# Patient Record
Sex: Female | Born: 1980 | Race: White | Hispanic: Yes | Marital: Married | Smoking: Never smoker
Health system: Southern US, Community
[De-identification: ages and names within clinical notes are randomized; demographics above are authoritative.]

## PROBLEM LIST (undated history)

## (undated) DIAGNOSIS — F419 Anxiety disorder, unspecified: Secondary | ICD-10-CM

## (undated) DIAGNOSIS — F32A Depression, unspecified: Secondary | ICD-10-CM

## (undated) DIAGNOSIS — F329 Major depressive disorder, single episode, unspecified: Secondary | ICD-10-CM

## (undated) DIAGNOSIS — E119 Type 2 diabetes mellitus without complications: Secondary | ICD-10-CM

## (undated) HISTORY — DX: Major depressive disorder, single episode, unspecified: F32.9

## (undated) HISTORY — DX: Depression, unspecified: F32.A

---

## 2000-01-13 ENCOUNTER — Encounter: Payer: Self-pay | Admitting: Obstetrics

## 2000-01-13 ENCOUNTER — Inpatient Hospital Stay (HOSPITAL_COMMUNITY): Admission: AD | Admit: 2000-01-13 | Discharge: 2000-01-13 | Payer: Self-pay | Admitting: Obstetrics

## 2000-01-31 ENCOUNTER — Emergency Department (HOSPITAL_COMMUNITY): Admission: EM | Admit: 2000-01-31 | Discharge: 2000-01-31 | Payer: Self-pay | Admitting: Emergency Medicine

## 2000-03-16 ENCOUNTER — Inpatient Hospital Stay (HOSPITAL_COMMUNITY): Admission: AD | Admit: 2000-03-16 | Discharge: 2000-03-16 | Payer: Self-pay | Admitting: Obstetrics

## 2000-03-31 ENCOUNTER — Encounter: Payer: Self-pay | Admitting: *Deleted

## 2000-03-31 ENCOUNTER — Inpatient Hospital Stay (HOSPITAL_COMMUNITY): Admission: RE | Admit: 2000-03-31 | Discharge: 2000-03-31 | Payer: Self-pay | Admitting: *Deleted

## 2000-04-02 ENCOUNTER — Inpatient Hospital Stay (HOSPITAL_COMMUNITY): Admission: AD | Admit: 2000-04-02 | Discharge: 2000-04-02 | Payer: Self-pay | Admitting: *Deleted

## 2000-04-03 ENCOUNTER — Encounter (HOSPITAL_COMMUNITY): Admission: RE | Admit: 2000-04-03 | Discharge: 2000-04-20 | Payer: Self-pay | Admitting: *Deleted

## 2000-04-13 ENCOUNTER — Inpatient Hospital Stay (HOSPITAL_COMMUNITY): Admission: AD | Admit: 2000-04-13 | Discharge: 2000-04-15 | Payer: Self-pay | Admitting: Obstetrics & Gynecology

## 2000-04-16 ENCOUNTER — Observation Stay (HOSPITAL_COMMUNITY): Admission: AD | Admit: 2000-04-16 | Discharge: 2000-04-17 | Payer: Self-pay | Admitting: Obstetrics & Gynecology

## 2000-04-16 ENCOUNTER — Encounter: Payer: Self-pay | Admitting: Obstetrics & Gynecology

## 2002-11-21 ENCOUNTER — Encounter: Payer: Self-pay | Admitting: Emergency Medicine

## 2002-11-21 ENCOUNTER — Emergency Department (HOSPITAL_COMMUNITY): Admission: EM | Admit: 2002-11-21 | Discharge: 2002-11-21 | Payer: Self-pay | Admitting: Emergency Medicine

## 2005-03-26 ENCOUNTER — Inpatient Hospital Stay (HOSPITAL_COMMUNITY): Admission: AD | Admit: 2005-03-26 | Discharge: 2005-03-26 | Payer: Self-pay | Admitting: Obstetrics

## 2005-04-05 ENCOUNTER — Inpatient Hospital Stay (HOSPITAL_COMMUNITY): Admission: AD | Admit: 2005-04-05 | Discharge: 2005-04-07 | Payer: Self-pay | Admitting: Obstetrics

## 2008-08-09 ENCOUNTER — Encounter: Admission: RE | Admit: 2008-08-09 | Discharge: 2008-08-09 | Payer: Self-pay | Admitting: Specialist

## 2008-09-12 ENCOUNTER — Encounter (INDEPENDENT_AMBULATORY_CARE_PROVIDER_SITE_OTHER): Payer: Self-pay | Admitting: General Surgery

## 2008-09-12 ENCOUNTER — Ambulatory Visit (HOSPITAL_COMMUNITY): Admission: RE | Admit: 2008-09-12 | Discharge: 2008-09-13 | Payer: Self-pay | Admitting: General Surgery

## 2009-09-11 IMAGING — US US ABDOMEN COMPLETE
1 series · 14 of 25 positions shown · non-contrast
Comparison: None

CLINICAL DATA: Persistent right upper quadrant pain.

ABDOMEN ULTRASOUND
TECHNIQUE: Complete abdominal ultrasound examination was performed
including evaluation of the liver, gallbladder, bile ducts,
pancreas, kidneys, spleen, IVC, and abdominal aorta.

[Series 1: us abdomen complete · 0.29mm/px · 14 of 85 slices shown]
[im 1/85]
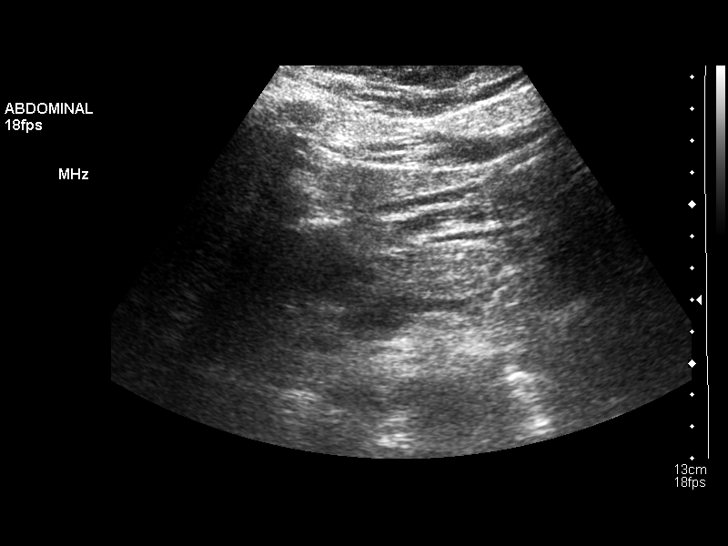
[im 8/85]
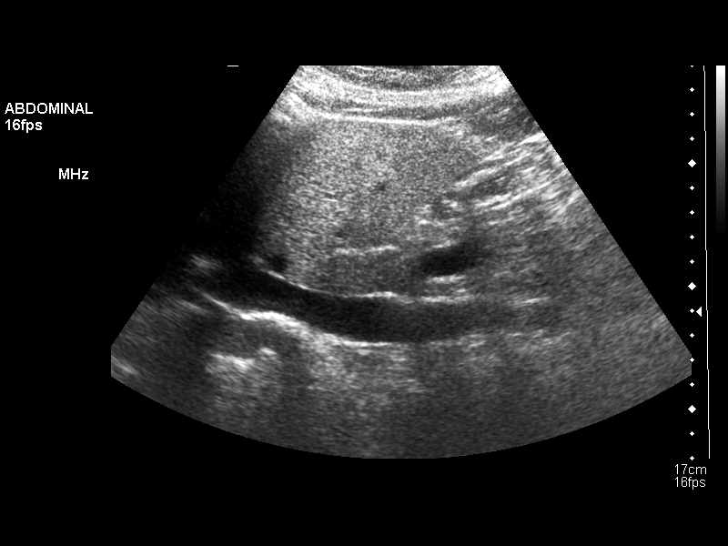
[im 15/85]
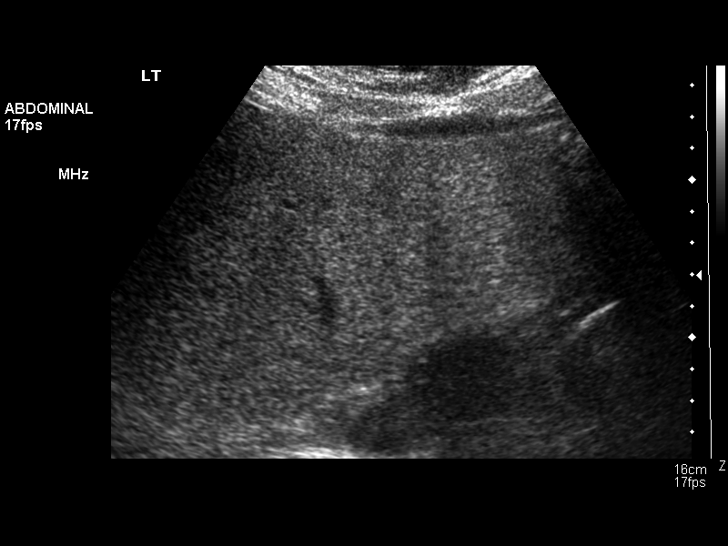
[im 22/85]
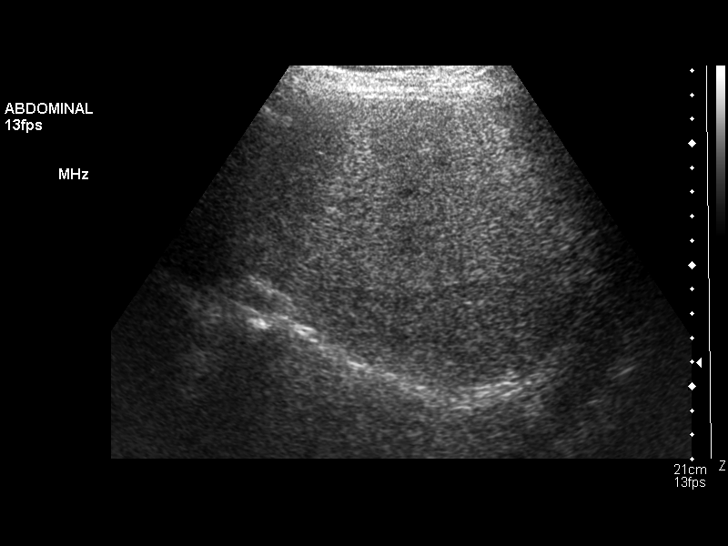
[im 29/85]
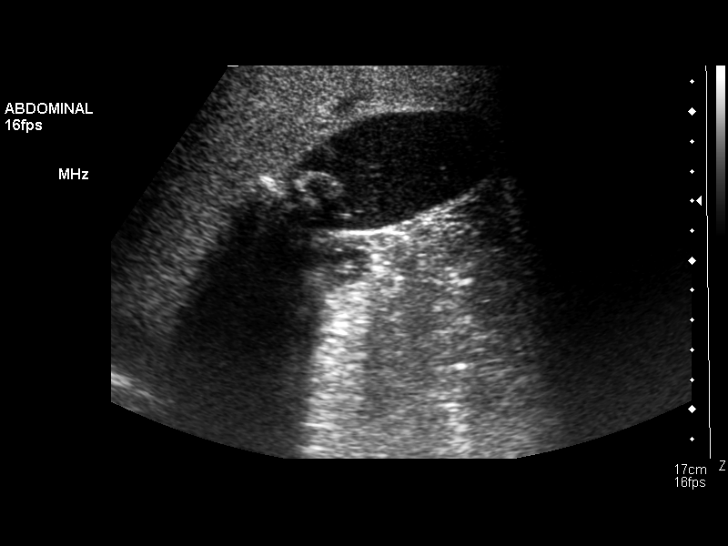
[im 32/85]
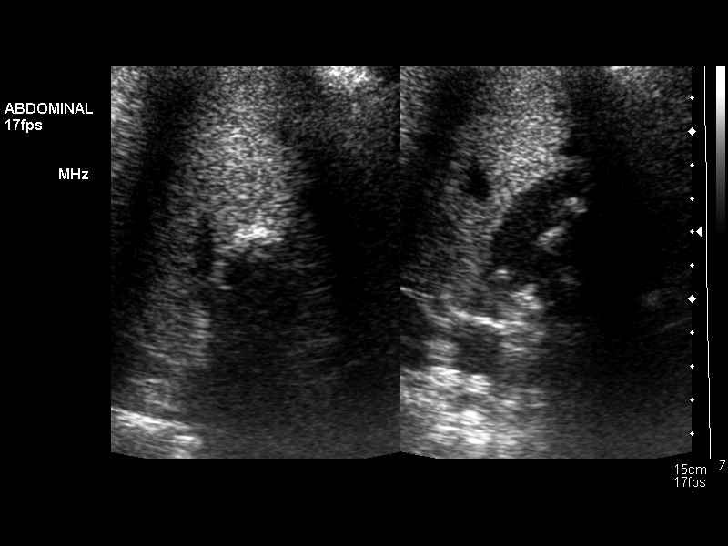
[im 39/85]
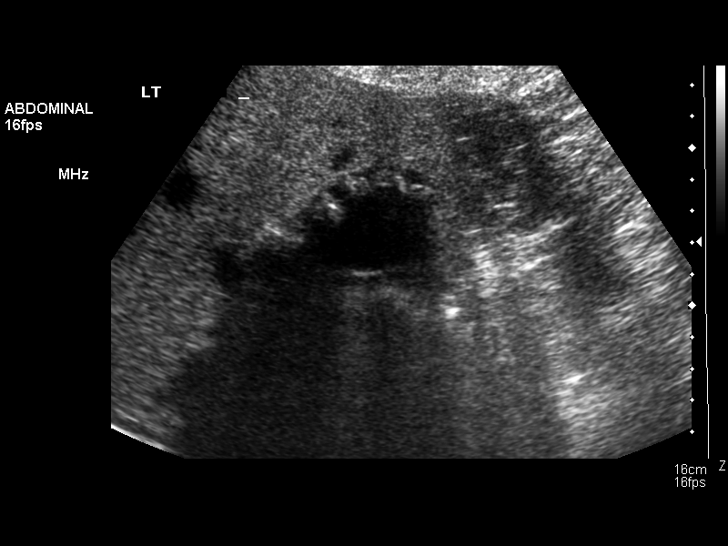
[im 46/85]
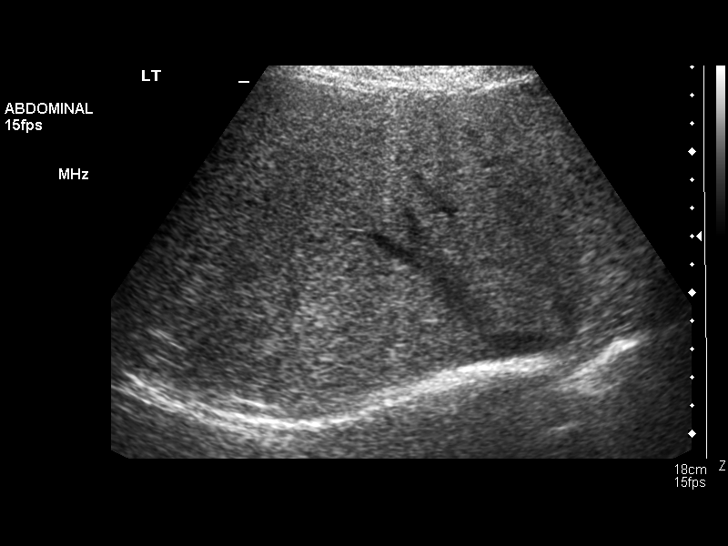
[im 53/85]
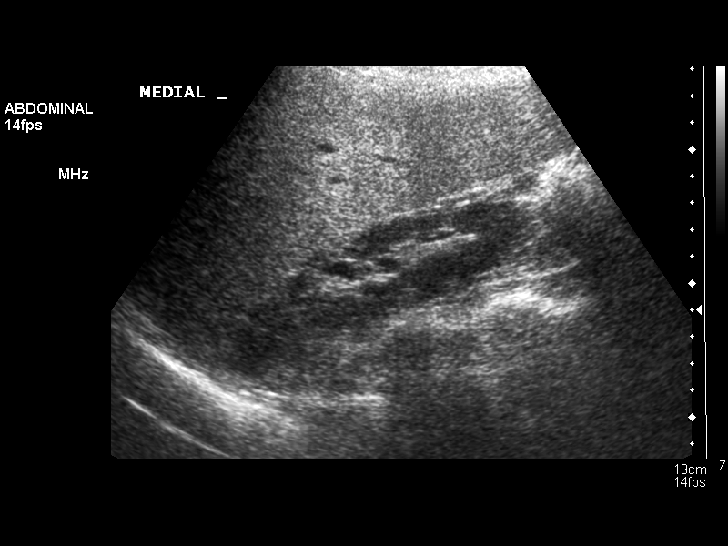
[im 57/85]
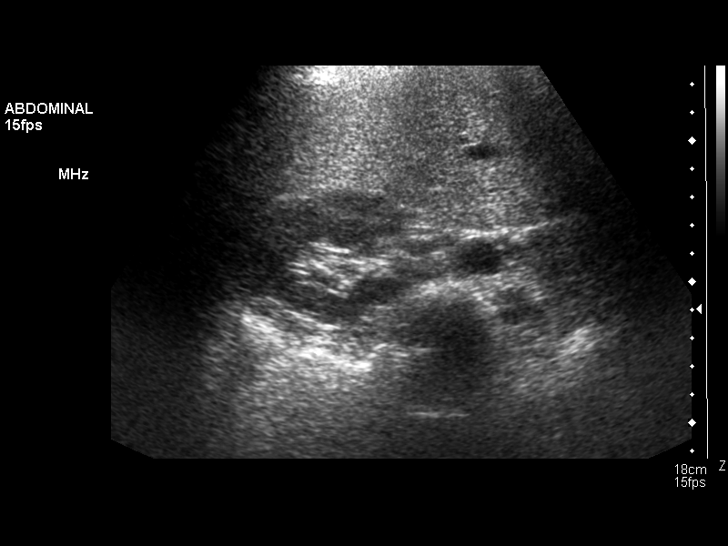
[im 64/85]
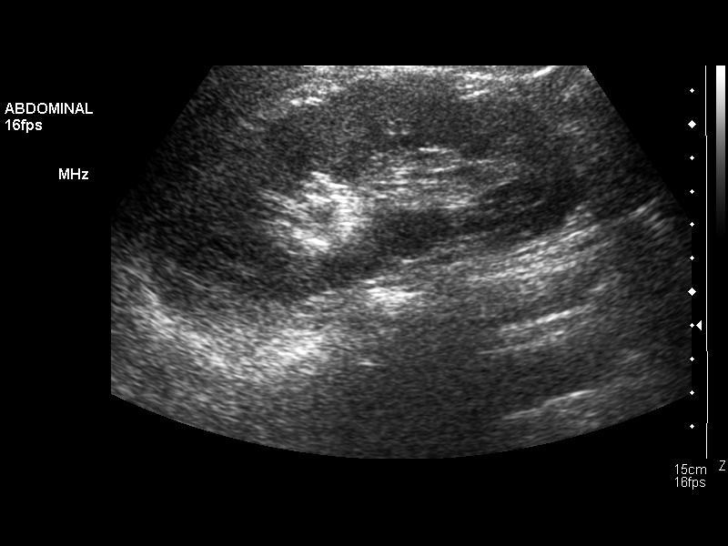
[im 71/85]
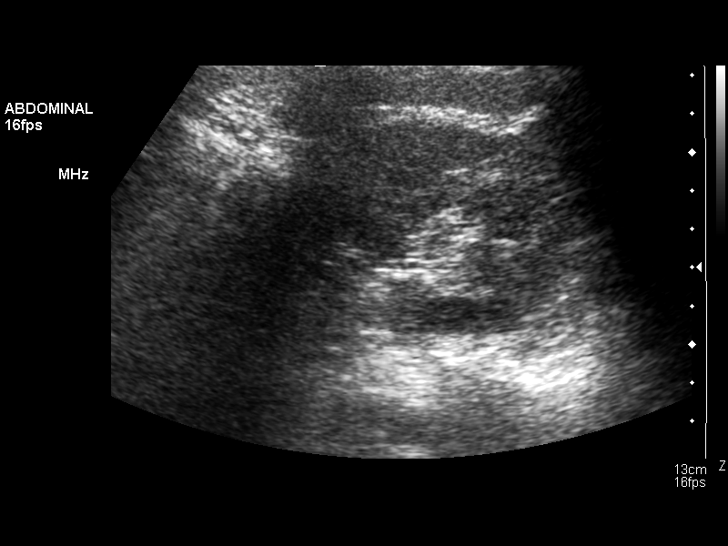
[im 78/85]
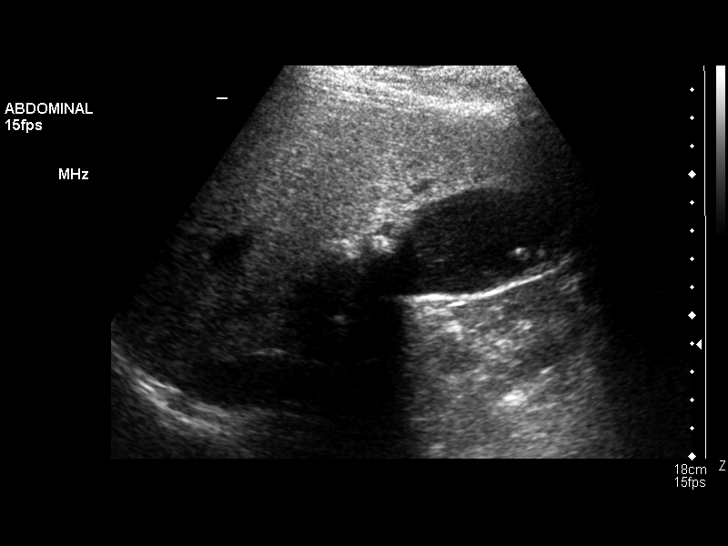
[im 85/85]
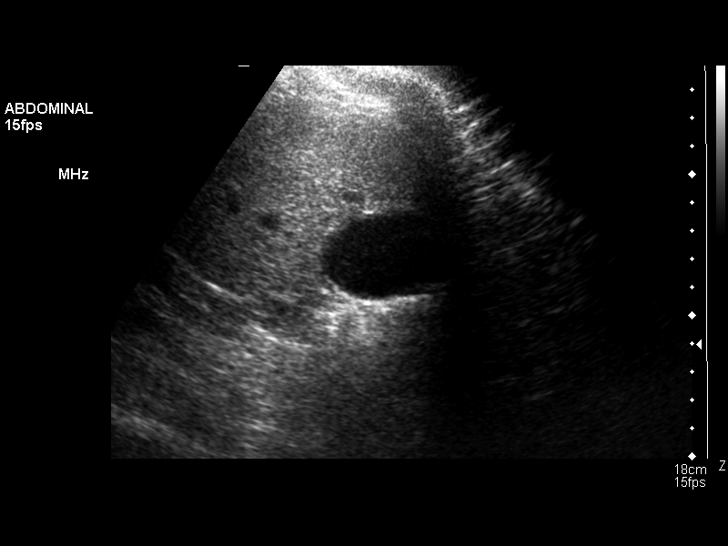

[14 of 25 positions shown; findings below may reference images not displayed]

FINDINGS: Liver is slightly echogenic.  There are echogenic foci in
the gallbladder, which are mobile and have posterior acoustic
shadowing.  One appears lodged in the neck of the gallbladder.
Gallbladder wall measures 2 mm.  No pericholecystic fluid or
sonographic Murphy's sign. Extrahepatic bile duct measures 4 mm.

IVC, visualized portion of the pancreas, spleen, kidneys and aorta
are unremarkable.
IMPRESSION: 1.  Cholelithiasis without acute cholecystitis.  One of the stones
appears lodged in the gallbladder neck.
2.  Fatty liver.

## 2009-10-15 IMAGING — RF DG CHOLANGIOGRAM OPERATIVE
1 series · 7 of 7 positions shown · non-contrast
Comparison: Ultrasound [DATE]

CLINICAL DATA: Laparoscopic cholecystectomy

INTRAOPERATIVE CHOLANGIOGRAM
TECHNIQUE: Multiple fluoroscopic spot radiographs were obtained
during intraoperative cholangiogram and are submitted for
interpretation post-operatively.

[Series 1: run · 4 acquisitions, 7 frames shown]
[im 1/4]
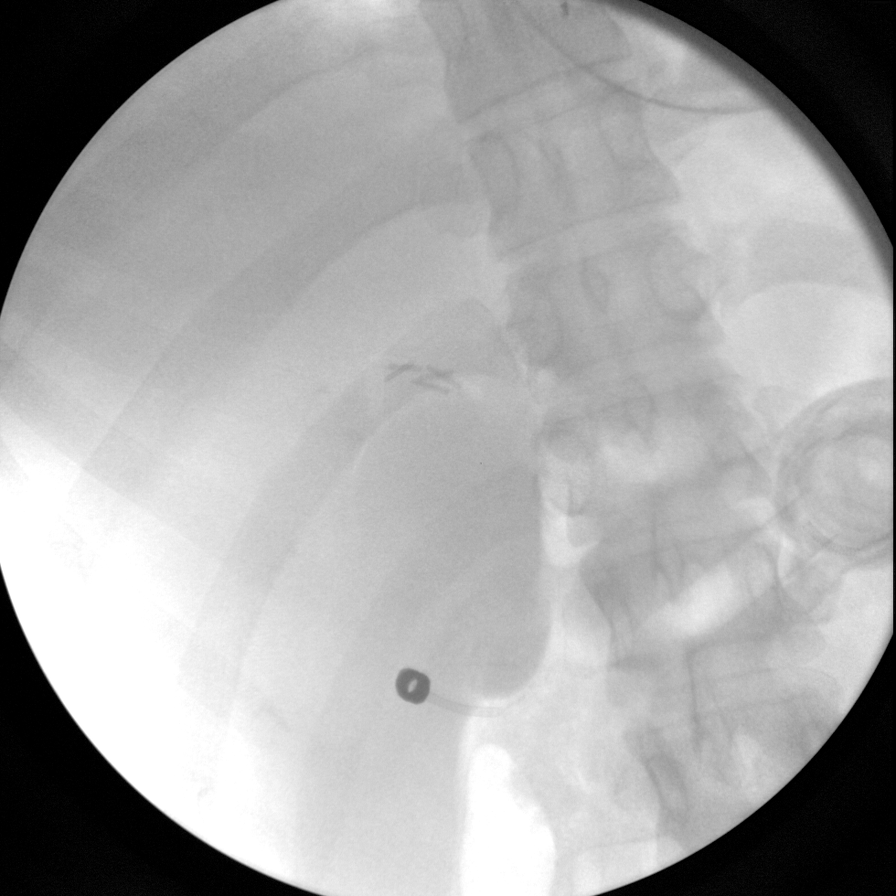
[im 2/4]
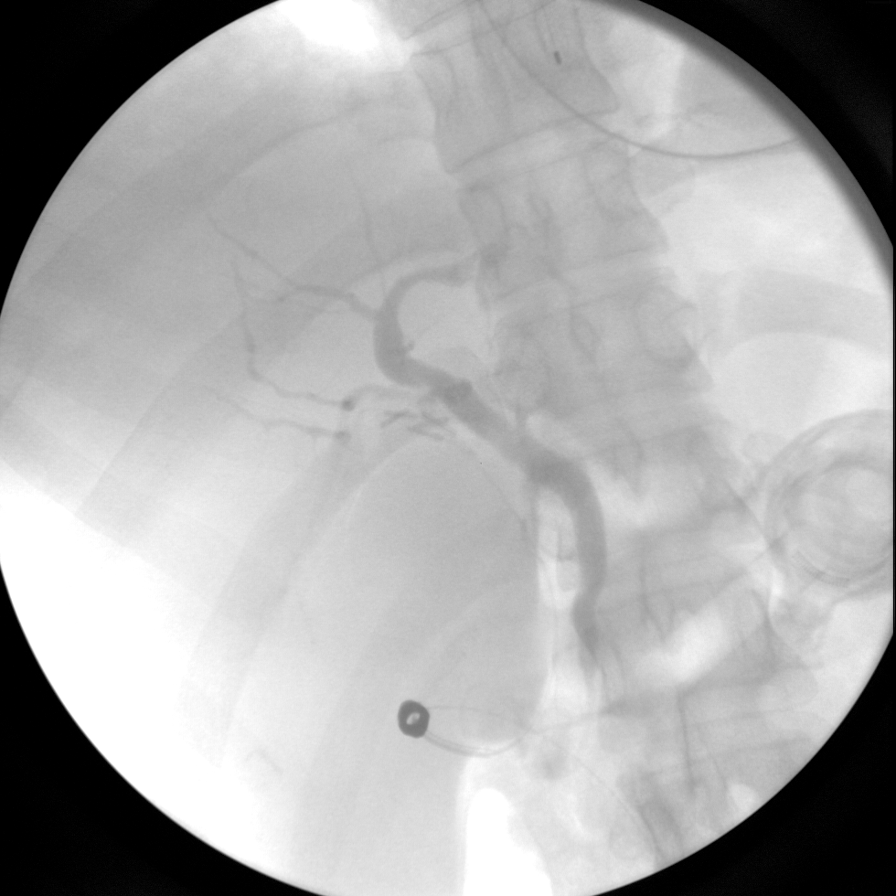
[im 3/4]
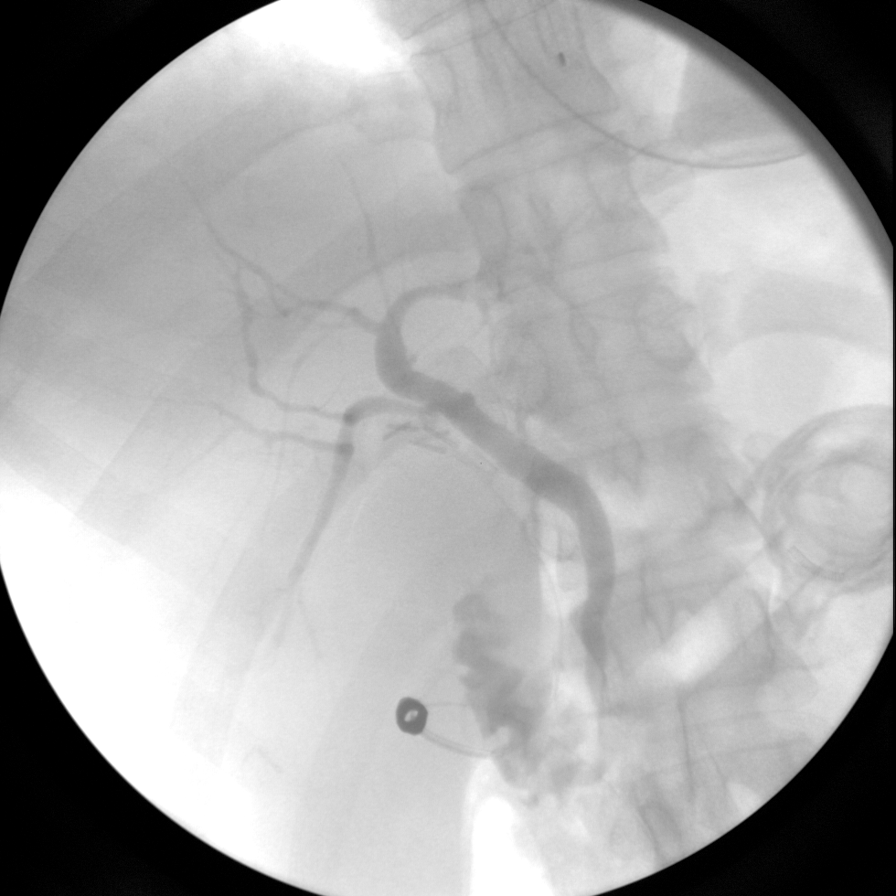
[im 4/4]
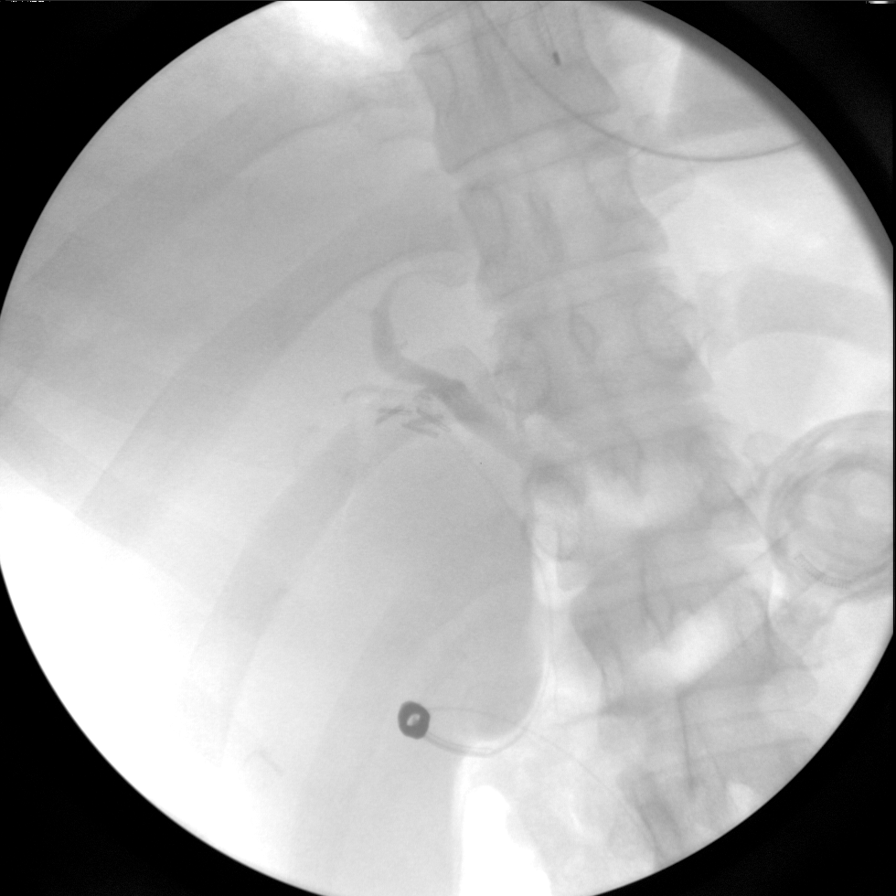
[im 4/4]
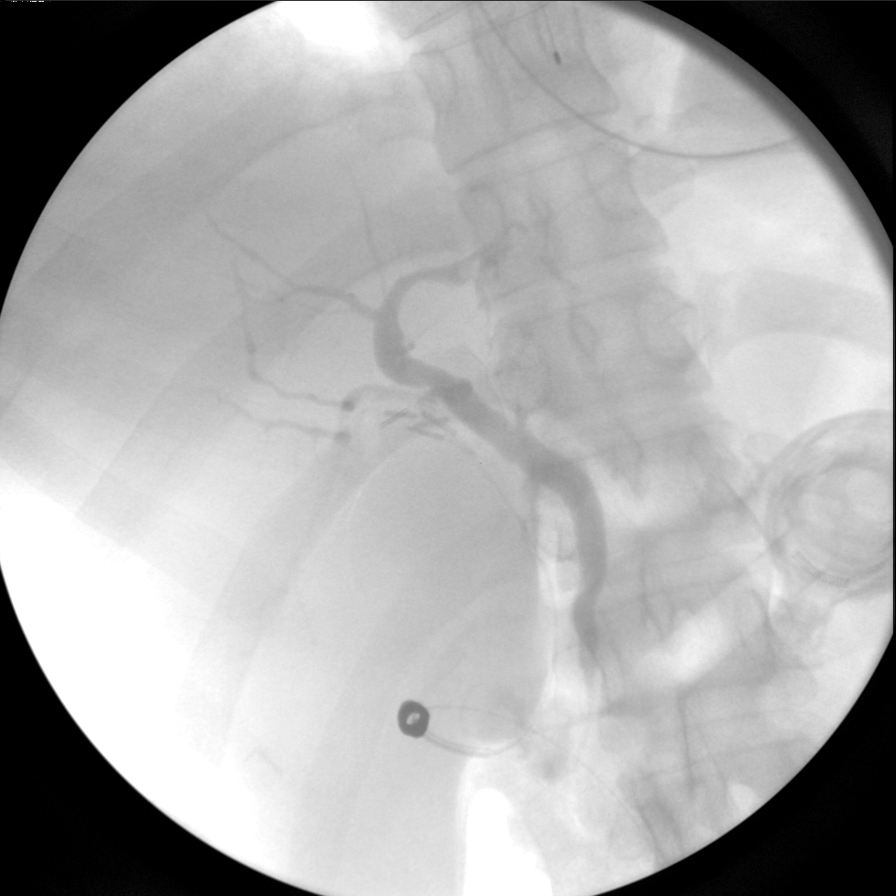
[im 4/4]
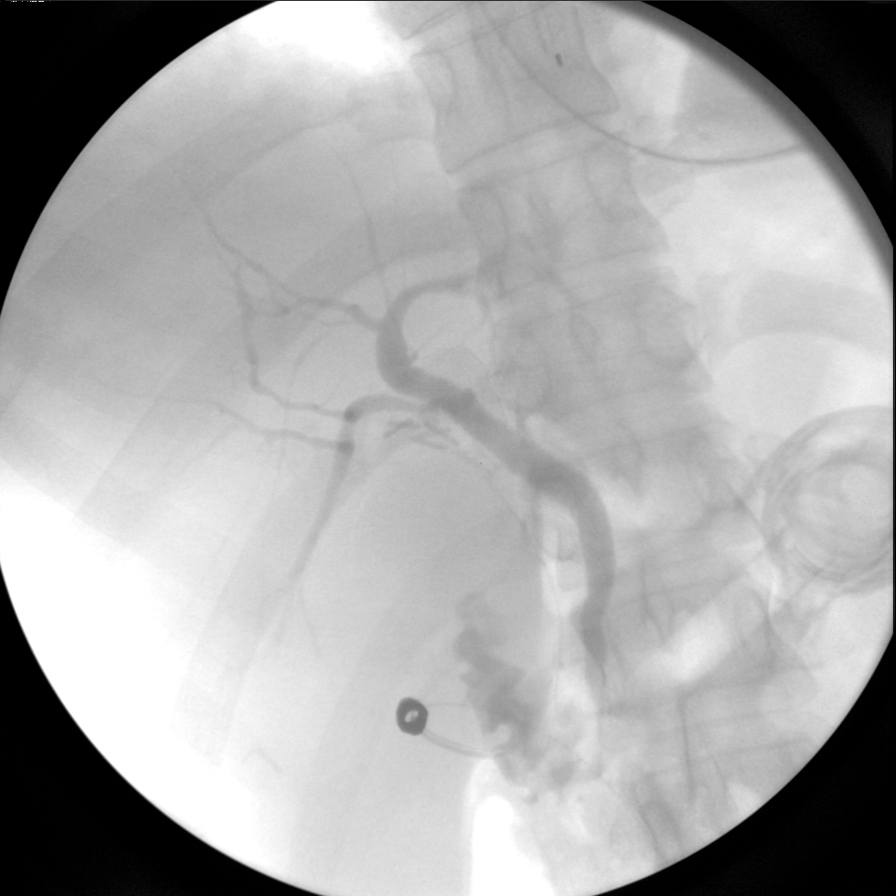
[im 4/4]
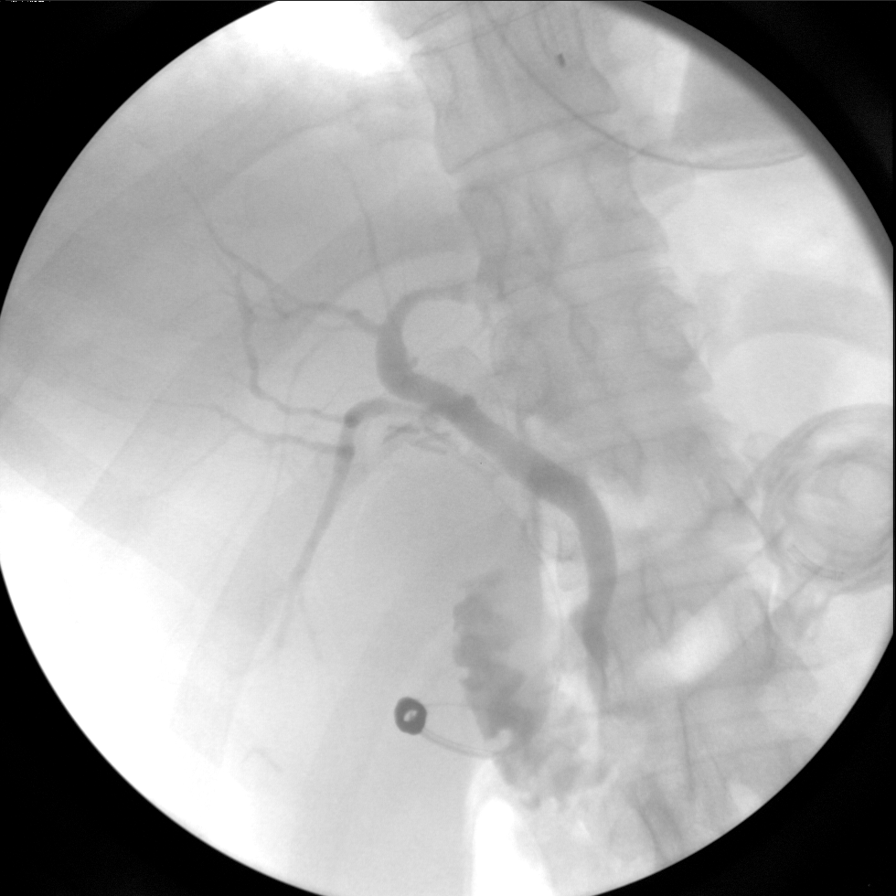

[7 of 7 positions shown; findings below may reference images not displayed]

FINDINGS: Contrast is injected through the cystic duct remnant.
There is good filling of the proximal intrahepatic ducts, the
common hepatic duct and the common bile duct.  There are no filling
defects.  Contrast flows freely into the duodenum.  Intrahepatic
ducts are questionably mildly irregular.  This is not definite.
IMPRESSION: No filling defect or obstruction.  Question slight irregularity of
the intrahepatic ducts.  This can be seen in cholangitis.

## 2010-10-03 LAB — DIFFERENTIAL
Basophils Relative: 1 % (ref 0–1)
Lymphocytes Relative: 31 % (ref 12–46)
Lymphs Abs: 2 10*3/uL (ref 0.7–4.0)
Monocytes Absolute: 0.3 10*3/uL (ref 0.1–1.0)
Monocytes Relative: 4 % (ref 3–12)
Neutro Abs: 4.2 10*3/uL (ref 1.7–7.7)

## 2010-10-03 LAB — CBC
HCT: 40 % (ref 36.0–46.0)
Hemoglobin: 14 g/dL (ref 12.0–15.0)
MCHC: 35.1 g/dL (ref 30.0–36.0)
MCV: 93 fL (ref 78.0–100.0)
Platelets: 179 10*3/uL (ref 150–400)
RBC: 4.3 MIL/uL (ref 3.87–5.11)
RDW: 11.8 % (ref 11.5–15.5)
WBC: 6.6 10*3/uL (ref 4.0–10.5)

## 2010-10-03 LAB — COMPREHENSIVE METABOLIC PANEL
Albumin: 4.2 g/dL (ref 3.5–5.2)
Alkaline Phosphatase: 82 U/L (ref 39–117)
BUN: 8 mg/dL (ref 6–23)
Calcium: 9.3 mg/dL (ref 8.4–10.5)
Potassium: 4.3 mEq/L (ref 3.5–5.1)
Total Protein: 7.1 g/dL (ref 6.0–8.3)

## 2010-11-05 NOTE — Op Note (Signed)
NAME:  DURENDA, PECHACEK NO.:  [id number]   MEDICAL RECORD NO.:  [id number]          PATIENT TYPE:  OIB   LOCATION:  5121                         FACILITY:  [facility]   PHYSICIAN:  Ollen Gross. Vernell Morgans, M.D. DATE OF BIRTH:  [birth year]   DATE OF PROCEDURE:  09/12/2008  DATE OF DISCHARGE:                               OPERATIVE REPORT   PREOPERATIVE DIAGNOSIS:  Gallstones.   POSTOPERATIVE DIAGNOSIS:  Gallstones.   PROCEDURE:  Laparoscopic cholecystectomy with intraoperative  cholangiogram.   SURGEON:  Ollen Gross. Vernell Morgans, MD   ASSISTANT:  Clovis Pu. Cornett, MD   ANESTHESIA:  General endotracheal.   PROCEDURE:  After informed consent was obtained, the patient was brought  to the operating room, placed in a supine position on the operating  table.  After adequate induction of general anesthesia, the patient's  abdomen was prepped with Betadine and draped in usual sterile manner.  The area below the umbilicus was infiltrated with 0.25% Marcaine.  A  small incision was made with a 15-blade knife.  This incision was  carried down through the subcutaneous tissue bluntly with a hemostat and  Army[employer]y retractors and the linea alba was identified.  The linea alba  was then incised with a 15-blade knife and each side was grasped with  Kocher clamps and elevated anteriorly.  The preperitoneal space was  probed bluntly with a hemostat.  The peritoneum was opened, access was  gained to the abdominal cavity.  A 0-Vicryl purse-string stitch was  placed in the fascia around the opening.  Hasson cannula was placed  through the opening anchored in place to previously placed Vicryl purse-  string stitch.  The abdomen was then insufflated with carbon dioxide  without difficulty.  The patient was placed in reverse Trendelenburg  position and rotated with the right side up.  Laparoscope was inserted  through the Hasson cannula and the right upper quadrant was inspected.  The dome of the  gallbladder and liver readily identified.  Next, the  epigastric region was infiltrated with 0.25% Marcaine.  A small incision  was made with a 15-blade knife and a 10-mm port was placed bluntly  through this incision into the abdominal cavity under direct vision.  Sites were then chosen laterally on the right side of the abdomen for  placement of 5-mm ports.  Each of these areas were infiltrated with  0.25% Marcaine.  Small stab incisions were made with a 15-blade knife  and 5-mm ports placed bluntly through these incisions into the abdominal  cavity under direct vision.  A blunt grasper was placed through the  lateral most 5-mm port and used to grasp the dome of the gallbladder and  elevated anteriorly and superiorly.  Another blunt grasper was placed  through the 5-mm port and used to retract on the body and neck of the  gallbladder.  The dome portion of the gallbladder appeared to be  somewhat intrahepatic and at the neck of the gallbladder, there was a  large stone that was impacted there and not mobile.  The dissector was  placed through the epigastric port and using electrocautery the  peritoneal reflection  at the gallbladder neck area was opened.  Blunt  dissection was then carried out in this area until the gallbladder neck  cystic duct junction was readily identified and a good window was  created.  A single clip was placed on the gallbladder neck.  A small  ductotomy was made just below the clip with laparoscopic scissors.  A 14-  gauge Angiocath was placed percutaneously through the anterior abdominal  wall under direct vision.  A Reddick cholangiogram catheter was placed  through the Angiocath and flushed.  The Reddick catheter was then placed  within the cystic duct and anchored in place with a clip.  Cholangiogram  was obtained that showed no filling defects, good emptying in duodenum,  and a short, but adequate cystic duct.  The anchoring clip and catheters  were then  removed from the patient.  Three clips placed proximal in the  cystic duct and duct was divided between the 2 sets of clips.  Posterior  to the cystic artery was identified and again dissected bluntly in a  circumferential manner until a good window was created.  Two clips  placed proximally and one distally on the artery and the artery was  divided between the 2.  Next, a laparoscopic hook cautery device was  used to separate the gallbladder from liver bed.  Prior to completely  detaching the gallbladder from liver bed, the liver bed was inspected  and several small bleeding points were coagulated with the  electrocautery until the area was completely hemostatic.  The  gallbladder was then detached and wrested away from the liver bed  without difficulty.  We did have to take a little bit of liver edge with  it because it was intrahepatic and this was also cauteried until the  area was completely hemostatic.  Laparoscopic bag was then inserted  through the epigastric port.  The gallbladder was placed in the bag and  bag was sealed.  The abdomen was then irrigated with copious amounts of  saline until the effluent was clear.  The liver bed was inspected again  and found to be hemostatic.  Two large pieces of Surgicel were then  placed on the liver bed as well.  The laparoscope was then moved to the  epigastric port.  A gallbladder grasper was placed through the Hasson  cannula and used to grasp the opening of the bag.  The bag with the  gallbladder was removed through the infraumbilical port without  difficulty.  The fascial defect was closed with a previously placed  Vicryl pursestring stitch as well as with another figure-of-eight 0-  Vicryl stitch.  The pelvis was also examined.  The patient did have what  looked like a fairly large ovary on the right side, we could not see the  left ovary very well.  The uterus also had some small fibroid type  changes on the surface.  The rest of ports  were then removed under  direct vision and were found to be hemostatic.  Gas was allowed to  escape.  Skin incisions were all closed with interrupted 4-0 Monocryl  subcuticular stitches.  Dermabond dressings were applied.  The patient  tolerated the procedure well.  At the end of the case, all needle,  sponge, and instrument counts were correct.  The patient was then  awakened, taken to recovery room in stable condition.      Ollen Gross. Vernell Morgans, M.D.  Electronically Signed     PST/MEDQ  D:  09/12/2008  T:  09/13/2008  Job:  [id number]

## 2015-08-25 ENCOUNTER — Ambulatory Visit (INDEPENDENT_AMBULATORY_CARE_PROVIDER_SITE_OTHER): Payer: Self-pay | Admitting: Urgent Care

## 2015-08-25 VITALS — BP 110/68 | HR 73 | Temp 98.3°F | Resp 18 | Ht 64.0 in | Wt 169.4 lb

## 2015-08-25 DIAGNOSIS — F329 Major depressive disorder, single episode, unspecified: Secondary | ICD-10-CM

## 2015-08-25 DIAGNOSIS — F419 Anxiety disorder, unspecified: Principal | ICD-10-CM

## 2015-08-25 DIAGNOSIS — R45851 Suicidal ideations: Secondary | ICD-10-CM

## 2015-08-25 DIAGNOSIS — F418 Other specified anxiety disorders: Secondary | ICD-10-CM

## 2015-08-25 DIAGNOSIS — G4700 Insomnia, unspecified: Secondary | ICD-10-CM

## 2015-08-25 MED ORDER — CITALOPRAM HYDROBROMIDE 10 MG PO TABS: 10.0000 mg | ORAL_TABLET | Freq: Every day | ORAL | Status: DC

## 2015-08-25 MED ORDER — LORAZEPAM 0.5 MG PO TABS: 0.5000 mg | ORAL_TABLET | Freq: Every day | ORAL | Status: DC

## 2015-08-25 NOTE — Progress Notes (Signed)
MRN: [id number] DOB: 1981-02-01  Subjective:   Diane Mills is a 35 y.o. female presenting for chief complaint of Depression  Reports ~4 year history of depression. She was being managed at [facility] with Celexa 20mg . She was weaned off of this medication ~2 months ago at her request. Today, she reports significant anxiety surrounding the death of a family friend. She reports that it has affected her significantly. Feels depressed, fearful, overwhelmed, has had insomnia, feels like she should be dead and is having passive thoughts about death. Denies any active plans about death, has never attempted to hurt herself or undergone hospitalization. She admits that she has been very open about this with her husband and he has supported her very well. She has 2 children and has good relationships with them. Of note, she did restart Celexa 20mg  in the past 2 days and has been experiencing nausea, loose stools, stomach upset.   Diane Mills has a current medication list which includes the following prescription(s): citalopram. Also is allergic to penicillins.  Diane Mills  has a past medical history of Depression. Also  has no past surgical history on file.  Denies family history of suicide.   Objective:   Vitals: BP 110/68 mmHg  Pulse 73  Temp(Src) 98.3 F (36.8 C) (Oral)  Resp 18  Ht 5\' 4"  (1.626 m)  Wt 169 lb 6.4 oz (76.839 kg)  BMI 29.06 kg/m2  SpO2 98%  LMP 08/23/2015  Physical Exam  Constitutional: She is oriented to person, place, and time. She appears well-developed and well-nourished.  HENT:  Mouth/Throat: Oropharynx is clear and moist.  Eyes: Pupils are equal, round, and reactive to light. Right eye exhibits no discharge. Left eye exhibits no discharge. No scleral icterus.  Neck: Normal range of motion. Neck supple. No thyromegaly present.  Cardiovascular: Normal rate, regular rhythm and intact distal pulses.  Exam reveals no gallop and no friction rub.   No murmur  heard. Pulmonary/Chest: No respiratory distress. She has no wheezes. She has no rales.  Abdominal: Soft. Bowel sounds are normal. She exhibits no distension and no mass. There is no tenderness.  Neurological: She is alert and oriented to person, place, and time.  Skin: Skin is warm and dry.  Psychiatric: Her mood appears anxious (appears restless, frequently changing positions and moving her hands on exam table). Her affect is not angry, not blunt, not labile and not inappropriate. Her speech is not rapid and/or pressured, not delayed, not tangential and not slurred. She is not agitated, not aggressive, not hyperactive, not slowed, not withdrawn and not actively hallucinating. Thought content is not paranoid and not delusional. She exhibits a depressed mood. She expresses no homicidal and no suicidal ideation. She expresses no suicidal plans and no homicidal plans. She is attentive.   Assessment and Plan :   1. Anxiety and depression 2. Passive suicidal ideations 3. Insomnia - I discussed at length the possibility of hospitalization with both the patient and her husband, Fernande Boydenntonio Ramirez, whom we reached by phone together at [phone]. They both feel that hospitalization is not necessary and contract for safety. I agreed to help patient with Celexa since she has done well with this in the past. I am going to have her start Celexa at 10mg , with close follow up. She will rtc on Friday, I advised that I would have a low threshold to send her to the ED for emergent psych evaluation and they agreed. She can take lorazepam for insomnia, anxiety. The husband  agreed to fill her medications and oversee them.    Wallis Bamberg, PA-C [facility] [phone] 08/25/2015 9:29 AM

## 2015-08-25 NOTE — Patient Instructions (Addendum)
Trastorno Tour manager (Major Depressive Disorder) El trastorno depresivo mayor es una enfermedad mental. Tambin se llamado depresin clnica o depresin unipolar. Produce sentimientos de tristeza, desesperanza o desamparo. Algunas personas con trastorno depresivo mayor no se sienten particularmente tristes, pero pierden Librarian, academic las cosas que solan disfrutar (anhedonia). Tambin puede causar sntomas fsicos. Interfiere en el trabajo, la escuela, las relaciones y otras actividades diarias normales. Puede variar en gravedad, pero es ms duradera y ms grave que la tristeza que todos sentimos de vez en cuando en nuestras vidas.  Muchas veces es desencadenada por sucesos estresantes o cambios importantes en la vida. Algunos ejemplos de estos factores desencadenantes son el divorcio, la prdida del trabajo o el hogar, [place], y la muerte de un familiar o amigo cercano. A veces aparece sin ninguna razn evidente. Las personas que tienen familiares con depresin mayor o con trastorno bipolar tienen ms riesgo de desarrollar depresin mayor con o sin factores de Psychologist, forensic. Puede ocurrir a Hotel manager. Puede ocurrir slo una vez en su vida(episodio nico de trastorno depresivo mayor). Puede ocurrir varias veces (trastorno depresivo mayor recurrente).  SNTOMAS  Las personas con trastorno depresivo mayor presentan anhedonia o estado de nimo deprimido casi [employer] durante al menos 2 semanas o ms. Los sntomas son:   Sensacin de tristeza Secretary/administrator) o vaco.  Sentimientos de desesperanza o desamparo.  Lagrimeo o episodios de llanto ( es observado por los dems).  Irritabilidad (en nios y adolescentes). Adems del estado de nimo deprimido o la anhedonia o ambos, estos enfermos tienen al menos cuatro de los siguientes sntomas :   Dificultad para dormir o Aeronautical engineer.   Cambio significativo (aumento o disminucin) en el apetito o Financial controller.   Falta de energa o  motivacin.  Sentimientos de culpa o desvalorizacin.   Dificultad para concentrarse, recordar o tomar decisiones.  Movimientos inusualmente lentos (retardo psicomotor retardation) o inquietud (segn lo observado por los dems).   Deseos recurrentes de muerte, pensamientos recurrentes de autoagresin (suicidio) o intento de suicidio. Las personas con trastorno depresivo mayor suelen tener pensamientos persistentes negativos acerca de s mismos, de otras personas y del mundo. Las personas con trastorno depresivo mayor grave pueden experimentar creencias o percepciones distorsionadas sobre el mundo (delirios psicticos). Tambin pueden ver u or cosas que no son reales(alucinaciones psicticas).  DIAGNSTICO  El diagnstico se realiza mediante una evaluacin hecha por el mdico. El mdico le preguntar acerca de los aspectos de su vida cotidiana, como el [place] de nimo, el sueo y el apetito, para ver si usted tiene los sntomas de Environmental education officer. Le har preguntas sobre su historial mdico y el consumo de alcohol o drogas, incluyendo medicamentos recetados. [employer] un examen fsico y Charity fundraiser anlisis de [place]. Esto se debe a que ciertas enfermedades y el uso de determinadas sustancias pueden causar sntomas similares a la depresin (depresin secundaria). Su mdico tambin podra derivarlo a Location manager salud mental para Ardelia Mems evaluacin y [place].  TRATAMIENTO  Es Glass blower/designer los sntomas y Geographical information systems officer. Los siguientes tratamientos pueden indicarse:    Medicamentos - Generalmente se recetan antidepresivos. Los antidepresivos se piensa que corrigen los desequilibrios qumicos en el cerebro que se asocian comnmente a la depresin Chiropractor. Se pueden agregar otros tipos de medicamentos si los sntomas no responden a los antidepresivos solos o si hay ideas delirantes o alucinaciones psicticas.  Psicoterapia - ciertos tipos de psicoterapia pueden ser tiles en el  tratamiento del trastorno de  la depresin mayor, proporcionando apoyo, educacin y orientacin. Ciertos tipos de psicoterapia tambin pueden ayudar a superar los pensamientos negativos (terapia cognitivo conductual) y a problemas de relacin que desencadena la depresin mayor (terapia interpersonal). Un especialista en salud mental puede ayudarlo a determinar qu tratamiento es el mejor para usted. La Harley-Davidsonmayora de los pacientes mejoran con una combinacin de medicacin y psicoterapia. Los tratamientos que implican la estimulacin elctrica del cerebro pueden ser utilizados en situaciones con sntomas muy graves o cuando los medicamentos y la psicoterapia no funcionan despus de un Indianatiempo. Estos tratamientos incluyen terapia electroconvulsiva, estimulacin magntica transcraneal y estimulacin del nervio vago.    Esta informacin no tiene Theme park managercomo fin reemplazar el consejo del mdico. Asegrese de hacerle al mdico cualquier pregunta que tenga.   Document Released: 10/04/2012 Document Revised: 06/30/2014 Elsevier Interactive Patient Education 2016 [employer].    Citalopram tablets Qu es [place]este medicamento? El CITALOPRAM es un medicamento para la depresin. Este medicamento puede ser utilizado para otros usos; si tiene alguna pregunta consulte con su proveedor de atencin mdica o con su farmacutico. Qu le debo informar a mi profesional de la salud antes de tomar este medicamento? Necesita saber si usted presenta alguno de los siguientes problemas o situaciones: -trastorno bipolar o antecedentes familiares del trastorno bipolar -diabetes -glaucoma -enfermedad cardiaca -antecedentes de pulso cardiaco irregular -enfermedad renal o heptica -niveles bajos de magnesio o potasio en la sangre -recibe tratamiento electroconvulsivo -convulsiones -ideas suicidas o intento de suicidio previo -una reaccin alrgica o inusual al citalopram, al escitalopram, otros medicamentos, alimentos, colorantes o  conservadores -si est embarazada o buscando quedar embarazada -si est amamantando a un beb Cmo debo utilizar este medicamento? Tome este medicamento por va oral con un vaso de agua. Siga las instrucciones de la etiqueta del Bethaltomedicamento. Este medicamento se puede tomar con o sin alimentos. Tome sus dosis a intervalos regulares. No tome su medicamento con una frecuencia mayor a la indicada. No deje de tomar PPL Corporationeste medicamento repentinamente a menos que as indique su mdico. El detener este medicamento demasiado rpido puede causar efectos secundarios graves o puede empeorar su condicin. Su farmacutico le dar una [place] del medicamento especial con cada receta y relleno. Asegrese de leer esta informacin cada vez cuidadosamente. Hable con su pediatra para informarse acerca del uso de este medicamento en nios. Puede requerir Customer service manageratencin especial. Los pacientes mayores de 60 aos de edad pueden presentar reacciones ms fuertes y Pension scheme managernecesitar dosis Liberty Globalmenores. Sobredosis: Pngase en contacto inmediatamente con un centro toxicolgico o una sala de urgencia si usted cree que haya tomado demasiado medicamento. ATENCIN: Reynolds AmericanEste medicamento es solo para usted. No comparta este medicamento con nadie. Qu sucede si me olvido de una dosis? Si olvida una dosis, tmela lo antes posible. Si es casi la hora de la prxima dosis, tome slo esa dosis. No tome dosis adicionales o dobles. Qu puede interactuar con este medicamento? No tome esta medicina con ninguno de los siguientes medicamentos: -ciertos medicamentos para infecciones micticas como fluconazol, itraconazol, quetoconazol, posaconazol, voriconazol -cisapride -dofetilida -dronedarona -escitalopram -linezolid -IMAOs, tales como Carbex, Eldepryl, Marplan, Nardil y Parnate -azul de Environmental managermetileno (Paramedicva intravenosa) -pimozida -tioridazina -ziprasidona Esta medicina tambin puede interactuar con los siguientes medicamentos: -alcohol -aspirina o medicamentos  tipo aspirina -carbamazepina -ciertos medicamentos para la depresin, ansiedad o trastornos psicticos -ciertos medicamentos usados para tratar infecciones, tales como cloroquina, Air cabin crewclaritromicina, eritromicina, pentamidina -ciertos medicamentos para migraas, tales como almotriptn, eletriptn, frovatriptn, naratriptn, rizatriptn, sumatriptn, zolmitriptn -ciertos medicamentos para dormir -ciertos medicamentos que tratan o previenen  cogulos sanguneos, como warfarina, enoxaparina, dalteparina -cimetidina -diurticos -fentanilo -litio -metadiba -metoprolol -los AINE, medicamentos para el dolor o inflamacin, tales como ibuprofeno o naproxeno -omeprazol -otros medicamentos que prolongan el intervalo QT (causa un ritmo cardiaco anormal) -procarbazina -rasagilina -suplementos como hierba de [place], kava kava, valeriana -tramadol -triptfano Puede ser que esta lista no menciona todas las posibles interacciones. Informe a su profesional de [employer] de [employer] productos a base de hierbas, medicamentos de [place] o suplementos nutritivos que est tomando. Si usted fuma, consume bebidas alcohlicas o si utiliza drogas ilegales, indqueselo tambin a su profesional de [employer]. Algunas sustancias pueden interactuar con su medicamento. A qu debo estar atento al usar P[employer] Corporation? Informe a su mdico si sus sntomas no mejoran o si empeoran. Visite a su mdico o a su profesional de la salud para chequear su evolucin peridicamente. Debido que puede ser necesario tomar este medicamento durante varias semanas para que sea posible observar sus efectos en forma [place], es importante que sigue su tratamiento como recetado por su mdico. Los pacientes y sus familias deben estar atentos si empeora la depresin o ideas suicidas. Tambin est atento a cambios repentinos o severos de emocin, tales como el sentirse ansioso, agitado, lleno de pnico, irritable, hostil, agresivo, impulsivo,  inquietud severa, demasiado excitado y hiperactivo o dificultad para conciliar el sueo. Si esto ocurre, especialmente al comenzar con el tratamiento o al cambiar de dosis, comunquese con su profesional de [employer]. Puede experimentar somnolencia o mareos. No conduzca ni utilice maquinaria, ni haga nada que Scientist, research (life sciences) en estado de alerta hasta que sepa cmo le afecta este medicamento. No se siente ni se ponga de pie con rapidez, especialmente si es un paciente de edad avanzada. Esto reduce el riesgo de mareos o [employer]. El alcohol puede interferir con el efecto de [place]. Evite consumir bebidas alcohlicas. Se le podr secar la boca. Masticar chicle sin azcar, chupar caramelos duros y beber agua en abundancia le ayudar a mantener la boca hmeda. Qu efectos secundarios puedo tener al [employer] este medicamento? Efectos secundarios que debe informar a su mdico o a Producer, television/film/video de la salud tan pronto como sea posible: -Therapist, art como erupcin cutnea, picazn o urticarias, hinchazn de la cara, labios o lengua -dolor en el pecho -confusin -mareos -pulso cardiaco rpido, irregular -habla rpidamente y presenta acciones o sentimientos agitados y fuera de control -sensacin de Corporate investment banker o aturdimiento, cadas -alucinaciones, prdida del contacto con la realidad -convulsiones -falta de aliento -ideas suicidas u otros cambios de estado de nimo -sangrado o magulladuras inusuales Efectos secundarios que, por lo general, no requieren Psychologist, prison and probation services (debe informarlos a su mdico o a su profesional de la salud si persisten o si son molestos): -visin borrosa -cambios en el apetito -cambios en el deseo sexual o capacidad -dolor de cabeza -aumento de la sudoracin -nuseas -dificultad para dormir Puede ser que esta lista no menciona todos los posibles efectos secundarios. Comunquese a su mdico por asesoramiento mdico [employer]ard. Usted puede  informar los efectos secundarios a la FDA por telfono al 1-800-FDA-1088. Dnde debo guardar mi medicina? Mantngala fuera del alcance de los nios. Gurdela a [employer], entre 15 y 30 grados C (73 y 16 grados F). Deseche todo el medicamento que no haya utilizado, despus de la fecha de vencimiento. ATENCIN: Este folleto es un resumen. Puede ser que no cubra toda la posible informacin. Si usted tiene preguntas acerca de esta medicina, consulte con su  mdico, su farmacutico o su profesional de [employer].    2016, Elsevier/Gold Standard. (2014-08-01 00:00:00)    Lorazepam tablets Qu es este medicamento? El LORAZEPAM es una benzodiacepina. Se utiliza para tratar la ansiedad. Este medicamento puede ser utilizado para otros usos; si tiene alguna pregunta consulte con su proveedor de atencin mdica o con su farmacutico. Qu le debo informar a mi profesional de la salud antes de tomar este medicamento? Necesita saber si usted presenta alguno de los siguientes problemas o situaciones: -problema de alcoholismo o drogadiccin -trastorno bipolar, depresin, psicosis u otros problemas de salud mental -glaucoma -enfermedad renal o heptica -enfermedad pulmonar o dificultades respiratorias -miastenia gravis -enfermedad de Parkinson -convulsiones o antecedentes de convulsiones -ideas suicidas -una reaccin alrgica o inusual al lorazepam, a otras benzodiacepinas, alimentos, colorantes o conservantes -si est embarazada o buscando quedar embarazada -si est amamantando a un beb Cmo debo utilizar este medicamento? Tome este medicamento por va oral con un vaso de agua. Siga las instrucciones de la etiqueta del [place]. Si el Social worker, tmelo con alimentos o con [place]. Tome sus dosis a intervalos regulares. No tome su medicamento con una frecuencia mayor a la indicada. No deje de tomarlo excepto si as lo indica su mdico o su profesional de [employer]. Hable con su pediatra para informarse acerca del uso de este medicamento en nios. Puede requerir atencin especial. Sobredosis: Pngase en contacto inmediatamente con un centro toxicolgico o una sala de urgencia si usted cree que haya tomado demasiado medicamento. ATENCIN: [employer] es solo para usted. No comparta este medicamento con nadie. Qu sucede si me olvido de una dosis? Si olvida una dosis, tmela lo antes posible. Si es casi la hora de la prxima dosis, tome slo esa dosis. No tome dosis adicionales o dobles. Qu puede interactuar con este medicamento? -barbitricos para inducir el sueo o para el tratamiento de convulsiones, tal como el fenobarbital -clozapina -medicamentos para la depresin, problemas mentales o trastornos psiquitricos -medicamentos para dormir -fenitona -probenecid -teofilina -cido valproico Puede ser que esta lista no menciona todas las posibles interacciones. Informe a su profesional de [employer] de [employer] productos a base de hierbas, medicamentos de [place] o suplementos nutritivos que est tomando. Si usted fuma, consume bebidas alcohlicas o si utiliza drogas ilegales, indqueselo tambin a su profesional de [employer]. Algunas sustancias pueden interactuar con su medicamento. A qu debo estar atento al usar [employer]PL Corporation? Visite a su mdico o a su profesional de la salud para chequear su evolucin peridicamente. Su cuerpo puede hacerse dependiente del medicamento, por esta razn, pregunte a su mdico o a su profesional de la salud si todava necesita tomarlo. Sin embargo, si ha venido [employer] de [place] regular durante algn tiempo, no deje de tomarlo repentinamente. Debe reducir gradualmente la dosis para no sufrir efectos secundarios severos. Consulte a su mdico o a su profesional de la salud antes de aumentar o reducir la dosis. Aun despus de dejar de tomarlo, los efectos del medicamento en su cuerpo pueden  perdurar [employer] Rx. Puede experimentar somnolencia o mareos. No conduzca ni utilice maquinaria, ni haga nada que Scientist, research (life sciences) en estado de alerta hasta que sepa cmo le afecta este medicamento. Para reducir el riesgo de mareos o [place], no se ponga de pie ni se siente con rapidez, especialmente si es un paciente de edad avanzada. El alcohol puede aumentar su somnolencia y [place]. Evite consumir bebidas alcohlicas. No se trate usted  mismo si tiene tos, resfro o Environmental consultant sin Science writer a su mdico o a su profesional de Radiographer, therapeutic. Algunos ingredientes pueden aumentar los posibles efectos secundarios. Qu efectos secundarios puedo tener al [employer] este medicamento? Efectos secundarios que debe informar a su mdico o a Producer, television/film/video de la salud tan pronto como sea posible: -cambios en la visin -confusin -depresin -cambios de humor, excitabilidad o comportamiento agresivo -dificultades con los movimientos, sensacin de vrtigo o movimientos entrecortados -calambres musculares -inquietud -cansancio o debilidad Efectos secundarios que, por lo general, no requieren atencin mdica (debe informarlos a su mdico o a su profesional de la salud si persisten o si son molestos): -estreimiento o diarrea -dificultad para conciliar el sueo, pesadillas -mareos o somnolencia -dolor de cabeza -nuseas, vmito Puede ser que [employer] no menciona todos los posibles efectos secundarios. Comunquese a su mdico por asesoramiento mdico [employer]ard. Usted puede informar los efectos secundarios a la FDA por telfono al 1-800-FDA-1088. Dnde debo guardar mi medicina? Mantngala fuera del alcance de los nios. Este medicamento puede ser abusado. Mantenga su medicamento en un lugar seguro para protegerlo contra robos. No comparta este medicamento con nadie. Es peligroso vender o Restaurant manager, fast food y est prohibido por la ley. Gurdela a [employer], entre 20 y 25  grados C (85 y 90 grados F). Protjala de la luz. Mantenga el envase bien cerrado. Deseche todo el medicamento que no haya utilizado, despus de la fecha de vencimiento. ATENCIN: Este folleto es un resumen. Puede ser que no cubra toda la posible informacin. Si usted tiene preguntas acerca de esta medicina, consulte con su mdico, su farmacutico o su profesional de Radiographer, therapeutic.    2016, Elsevier/Gold Standard. (2014-08-01 00:00:00)

## 2015-08-31 ENCOUNTER — Ambulatory Visit (INDEPENDENT_AMBULATORY_CARE_PROVIDER_SITE_OTHER): Payer: Self-pay | Admitting: Urgent Care

## 2015-08-31 VITALS — BP 108/72 | HR 70 | Temp 97.7°F | Resp 14 | Ht 64.5 in | Wt 169.8 lb

## 2015-08-31 DIAGNOSIS — F329 Major depressive disorder, single episode, unspecified: Secondary | ICD-10-CM

## 2015-08-31 DIAGNOSIS — F419 Anxiety disorder, unspecified: Secondary | ICD-10-CM

## 2015-08-31 DIAGNOSIS — F32A Depression, unspecified: Secondary | ICD-10-CM

## 2015-08-31 MED ORDER — CYCLOBENZAPRINE HCL 10 MG PO TABS: 10.0000 mg | ORAL_TABLET | Freq: Every day | ORAL | Status: DC

## 2015-08-31 NOTE — Patient Instructions (Signed)
Cyclobenzaprine tablets Qu es este medicamento? La CICLOBENZAPRINA es un relajante muscular. Se utiliza para tratar Starwood Hotelsel dolor y la rigidez de los msculos y espasmos musculares. Este medicamento puede ser utilizado para otros usos; si tiene alguna pregunta consulte con su proveedor de atencin mdica o con su farmacutico. Qu le debo informar a mi profesional de la salud antes de tomar este medicamento? Necesita saber si usted presenta alguno de los siguientes problemas o situaciones: -enfermedad cardiaca, pulso cardiaco irregular o ataque cardiaco previo -enfermedad heptica -problemas tiroideos -una reaccin alrgica o inusual a la ciclobenzaprina, antidepresivos tricclicos, lactosa, a otros medicamentos, alimentos, colorantes o conservadores -si est embarazada o buscando quedar embarazada -si est amamantando a un beb Cmo debo utilizar este medicamento? Tome este medicamento por va oral con un vaso de agua. Siga las instrucciones de la etiqueta del Eriemedicamento. Si este Social workermedicamento le produce malestar estomacal, tmelo con alimentos o con WPS Resourcesleche. No tome su medicamento con una frecuencia mayor a la indicada. Hable con su pediatra para informarse acerca del uso de este medicamento en nios. Puede requerir atencin especial. Sobredosis: Pngase en contacto inmediatamente con un centro toxicolgico o una sala de urgencia si usted cree que haya tomado demasiado medicamento. ATENCIN: Reynolds AmericanEste medicamento es solo para usted. No comparta este medicamento con nadie. Qu sucede si me olvido de una dosis? Si olvida una dosis, tmela lo antes posible. Si es casi la hora de su dosis siguiente, tome slo esa dosis. No tome dosis adicionales o dobles. Qu puede interactuar con este medicamento? No tome esta medicina con ninguno de los siguientes medicamentos: -ciertos medicamentos para infecciones micticas, tales como fluconazol, quetoconazol, itraconazol, posaconazol,  voriconazol -cisapride -dofetilida -dronedarona -droperidol -flecainida -grepafloxacino -halofantrina -levometadilo -IMAOs tales como Carbex, Eldepryl, Marplan, Nardil y Parnate -nilotinib -pimozida -probucol -sertindol -tioridazina -ziprasidona Esta medicina tambin puede interactuar con los siguientes medicamentos: -abarlix -alcohol -ciertos medicamentos para el cncer -ciertos medicamentos para la depresin, ansiedad o trastornos psicticos -ciertos medicamentos para infecciones, tales como alfuzosn, cloroquina, claritromicina, levofloxacino, mefloquina, pentamidina, troleandomicina -ciertos medicamentos para el pulso cardiaco irregular -ciertos medicamentos usados para hacerle dormir o para entumecer durante una ciruga o procedimiento -colorantes de Arts development officercontraste -dolasetrn -guanetidina -metadona -octreotida -ondansetrn -otros medicamentos que prolongan el intervalo QT (causa un ritmo cardiaco anormal) -palonosetrn -fenotiazinas, tales como clorpromacina, mesoridazina, proclorperazina, tioridazina -tramadol -vardenafil Puede ser que esta lista no menciona todas las posibles interacciones. Informe a su profesional de Beazer Homesla salud de [employer] Inctodos los productos a base de hierbas, medicamentos de [place]a libre o suplementos nutritivos que est tomando. Si usted fuma, consume bebidas alcohlicas o si utiliza drogas ilegales, indqueselo tambin a su profesional de Beazer Homesla salud. Algunas sustancias pueden interactuar con su medicamento. A qu debo estar atento al usar PPL Corporationeste medicamento? Consulte a su mdico o a su profesional de la salud si su problema no mejora en 1 a 3 semanas. Puede experimentar somnolencia o mareos al comenzar con el medicamento o al cambiar de dosis. No conduzca ni utilice maquinaria, ni haga nada que sea peligroso hasta que sepa cmo le afecta este medicamento. Sintese y pngase de pie lentamente. Se le podr secar la boca. Masticar chicle sin azcar, chupar caramelos  duros y beber agua le ayudarn a Pharmacologistmantener la boca hmeda. Qu efectos secundarios puedo tener al Boston Scientificutilizar este medicamento? Efectos secundarios que debe informar a su mdico o a Producer, television/film/videosu profesional de la salud tan pronto como sea posible: -reacciones alrgicas como erupcin cutnea, picazn o urticarias, hinchazn de la cara, labios o lengua -dolor en  el pecho -pulso cardiaco rpido -alucinaciones -convulsiones -vmito Efectos secundarios que, por lo general, no requieren atencin mdica (debe informarlos a su mdico o a su profesional de la salud si persisten o si son molestos): -dolor de cabeza Puede ser que esta lista no menciona todos los posibles efectos secundarios. Comunquese a su mdico por asesoramiento mdico [employer]. Usted puede informar los efectos secundarios a la FDA por telfono al 1-800-FDA-1088. Dnde debo guardar mi medicina? Mantngala fuera del alcance de los nios. Gurdela a [employer], entre 15 y 30 grados C (33 y 63 grados F). Mantenga el envase bien cerrado. Deseche todo el medicamento que no haya utilizado, despus de la fecha de vencimiento. ATENCIN: Este folleto es un resumen. Puede ser que no cubra toda la posible informacin. Si usted tiene preguntas acerca de esta medicina, consulte con su mdico, su farmacutico o su profesional de Radiographer, therapeutic.    2016, Elsevier/Gold Standard. (2014-08-01 00:00:00)

## 2015-08-31 NOTE — Progress Notes (Signed)
    MRN: [id number] DOB: [birth year]  Subjective:   Diane Mills is a 35 y.o. female presenting for follow up on depression and anxiety.   Patient was seen on 08/25/2015, restarted on citalopram for depression and anxiety. She agreed to close f/u due to thoughts about death. Her husband contracted for her safety together with the patient. She presents today for f/u. Reports that she feels better. She no longer has thoughts that she may hurt herself. She still feels like she could die, feels anxiety about the death of her friend (mva). She reports that her energy is better, sleeping better as well, lorazepam helps with this. She no longer has vomiting, nausea, diarrhea. Still has reflux, relieved with Zantac. Her husband is with her today, reports that she has been doing better but still having some difficulty with anxiety.  Diane Mills has a current medication list which includes the following prescription(s): citalopram and lorazepam. Also is allergic to penicillins.  Diane Mills  has a past medical history of Depression. Also  has no past surgical history on file.  Objective:   Vitals: BP 108/72 mmHg  Pulse 70  Temp(Src) 97.7 F (36.5 C) (Oral)  Resp 14  Ht 5' 4.5" (1.638 m)  Wt 169 lb 12.8 oz (77.021 kg)  BMI 28.71 kg/m2  SpO2 98%  LMP 08/23/2015  Physical Exam  Constitutional: She is oriented to person, place, and time. She appears well-developed and well-nourished.  Cardiovascular: Normal rate.   Pulmonary/Chest: Effort normal.  Neurological: She is alert and oriented to person, place, and time.  Psychiatric: Her mood appears anxious. Her speech is not rapid and/or pressured, not delayed and not slurred. She is not agitated and not slowed. Thought content is not delusional. She exhibits a depressed mood (flat affect). She expresses no homicidal and no suicidal ideation.   Assessment and Plan :   1. Depression 2. Anxiety - Improved since cutting down to 10mg  citalopram.  Patient and her husband contract for safety. They would like to stay at this dose for now. I agreed but may need to increase in 5 weeks. She may continue using lorazepam for sleep and anxiety. However, I advised patient use this as needed only. Otherwise, she can use Flexeril for secondary effect of sleepiness. She is aware that she needs to come in for recheck before then if she starts to get worse.   Wallis BambergMario Kailah Pennel, PA-C [facility] [phone] 08/31/2015 10:11 AM

## 2015-09-17 ENCOUNTER — Telehealth: Payer: Self-pay

## 2015-09-17 NOTE — Telephone Encounter (Signed)
Diane Mills states Diane Mills told him to call back if he had any questions regarding his wife's diagnosis and medication and he really would like to speak with him Please call [phone]

## 2015-09-19 NOTE — Telephone Encounter (Signed)
Patient's husband reports that his wife has had loose stools for ~4 days, has 4 BM/day. She has also had some belly pain and headache. I advised they come in to clinic for recheck on Friday 09/21/2015.

## 2016-10-22 ENCOUNTER — Ambulatory Visit (INDEPENDENT_AMBULATORY_CARE_PROVIDER_SITE_OTHER): Payer: Self-pay | Admitting: Urgent Care

## 2016-10-22 ENCOUNTER — Encounter: Payer: Self-pay | Admitting: Urgent Care

## 2016-10-22 VITALS — BP 114/76 | HR 72 | Temp 98.8°F | Resp 17 | Ht 64.5 in | Wt 187.0 lb

## 2016-10-22 DIAGNOSIS — J029 Acute pharyngitis, unspecified: Secondary | ICD-10-CM

## 2016-10-22 DIAGNOSIS — J019 Acute sinusitis, unspecified: Secondary | ICD-10-CM

## 2016-10-22 DIAGNOSIS — R0982 Postnasal drip: Secondary | ICD-10-CM

## 2016-10-22 DIAGNOSIS — F32A Depression, unspecified: Secondary | ICD-10-CM

## 2016-10-22 DIAGNOSIS — J3089 Other allergic rhinitis: Secondary | ICD-10-CM

## 2016-10-22 DIAGNOSIS — F329 Major depressive disorder, single episode, unspecified: Secondary | ICD-10-CM

## 2016-10-22 MED ORDER — DOXYCYCLINE HYCLATE 100 MG PO CAPS: 100.0000 mg | ORAL_CAPSULE | Freq: Two times a day (BID) | ORAL | 0 refills | Status: DC

## 2016-10-22 MED ORDER — PSEUDOEPHEDRINE HCL ER 120 MG PO TB12: 120.0000 mg | ORAL_TABLET | Freq: Two times a day (BID) | ORAL | 3 refills | Status: AC

## 2016-10-22 NOTE — Progress Notes (Signed)
  MRN: [id number] DOB: 1981-01-30  Subjective:   Diane Mills is a 36 y.o. female presenting for chief complaint of Sore Throat (onset 2 weeks); Cough (2 weeks); and Headache  Sinusitis - Reports 2 week history of sore throat, difficulty swallowing, productive cough. Reports bilateral ear pain, sinus congestion. Has tried Advil with minimal relief. She was seen at a different practice for amoxicillin. However, she has a history of PCN allergies and was not able to take this. Reports history of allergies, uses Zyrtec daily for this. Denies fever, sinus pain, ear drainage, chest pain, shob, wheezing, n/v, abdominal pain, rashes. Denies smoking cigarettes.  Depression - Patient is managed with citalopram . Has been taking this for 3 months now. Admits feeling better with her depression. Denies SI, HI, feelings of guilt, slowed speech or movement, agitation, insomnia. She was recently seen for f/u by her PCP, had elevated LFTs.   Diane Mills has a current medication list which includes the following prescription(s): cetirizine and citalopram. Also is allergic to penicillins. Diane Mills  has a past medical history of Depression. Denies past surgical history.   Objective:   Vitals: BP 114/76 (BP Location: Right Arm, Patient Position: Sitting, Cuff Size: Normal)   Pulse 72   Temp 98.8 F (37.1 C) (Oral)   Resp 17   Ht 5' 4.5" (1.638 m)   Wt 187 lb (84.8 kg)   LMP 09/29/2016   SpO2 94%   BMI 31.60 kg/m   Physical Exam  Constitutional: She is oriented to person, place, and time. She appears well-developed and well-nourished.  HENT:  TM's intact bilaterally, no effusions or erythema. Nasal turbinates dry and erythematous, nasal passages patent. Frontal sinus tenderness. Oropharynx with moderate post-nasal drainage, mucous membranes moist.  Eyes: Right eye exhibits no discharge. Left eye exhibits no discharge. No scleral icterus.  Neck: Normal range of motion. Neck supple.    Cardiovascular: Normal rate, regular rhythm and intact distal pulses.  Exam reveals no gallop and no friction rub.   No murmur heard. Pulmonary/Chest: No respiratory distress. She has no wheezes. She has no rales.  Lymphadenopathy:    She has no cervical adenopathy.  Neurological: She is alert and oriented to person, place, and time.  Skin: Skin is warm and dry.  Psychiatric: She has a normal mood and affect.   Assessment and Plan :   1. Acute non-recurrent sinusitis, unspecified location 2. Seasonal allergic rhinitis due to other allergic trigger 3. Sore throat 4. Post-nasal drainage - Maintain Zyrtec, add Sudafed as needed. Start doxycycline to address secondary sinusitis and given PCN allergy. Counseled patient on potential for adverse effects with medications prescribed today, she verbalized understanding. Recheck in 1 week if no improvement.  5. Depression, unspecified depression type - Maintain current dose, recheck LFTs in 3 months.  Wallis Bamberg, PA-C [facility] [phone] 10/22/2016  12:16 PM

## 2016-10-22 NOTE — Patient Instructions (Addendum)
Sinusitis en adultos (Sinusitis, Adult) La sinusitis es la inflamacin y el dolor en los senos paranasales. Los senos paranasales son espacios vacos en los huesos alrededor del rostro. Los senos paranasales se encuentran ubicados:  Alrededor de los ojos.  En la mitad de la frente.  Detrs de la nariz.  En los pmulos. Los senos y las fosas nasales estn cubiertos de un lquido fibroso (mucosidad). Normalmente, la mucosidad drena a travs de los senos. Cuando los tejidos nasales se inflaman o hinchan, la mucosidad puede quedar atrapada o bloqueada, de modo que no puede fluir por los senos paranasales. Esto fomenta la proliferacin de bacterias, virus y hongos, lo que produce infecciones. La sinusitis puede desarrollarse rpidamente y durar entre 7 y 10das (aguda) o ms de 12das (crnica). A menudo, esta afeccin surge despus de un resfriado. CAUSAS Esta afeccin es causada por cualquier sustancia que inflame los senos o evite que la mucosidad drene, por ejemplo:  Alergias.  Asma.  Infecciones virales o bacterianas.  Huesos con forma anmala entre las fosas nasales.  Crecimientos nasales que contienen mucosidad (plipos nasales).  Aberturas sinusales estrechas.  Agentes contaminantes, como sustancias qumicas o irritantes presentes en el aire.  Un cuerpo extrao atorado en la nariz.  Infecciones por hongos. Esto es raro. FACTORES DE RIESGO Los siguientes factores pueden hacer que usted sea propenso a sufrir esta afeccin:  Padecer alergias o asma.  Haber tenido una infeccin reciente en las vas respiratorias superiores o un resfriado.  Tener deformidades estructurales o bloqueos en la nariz o los senos.  Tener un sistema inmunitario dbil.  Nadar o bucear mucho.  Abusar de los aerosoles nasales.  Fumar. SNTOMAS Los principales sntomas de esta afeccin son dolor y sensacin de presin alrededor de los senos afectados. Otros sntomas pueden ser los  siguientes:  Dolor en los dientes superiores.  Dolor de odos.  Dolor de cabeza.  Mal aliento.  Disminucin del sentido del olfato y del gusto.  Tos que empeora por la noche.  Fatiga.  Fiebre.  Mucosidad espesa que sale de la nariz. Generalmente, es de color verde y puede contener pus (purulento).  Nariz tapada o congestin nasal.  Goteo posnasal. Esto ocurre cuando se acumula mucosidad adicional en la garganta o la parte de atrs de la nariz.  Hinchazn y calor en los senos paranasales afectados.  Dolor de garganta.  Sensibilidad a la luz. DIAGNSTICO Esta enfermedad se diagnostica en funcin de los sntomas, los antecedentes mdicos y un examen fsico. Para descubrir si su afeccin es aguda o crnica, el mdico podra hacer lo siguiente:  Revisar su nariz en busca de plipos nasales.  Palpar los senos paranasales afectados para buscar signos de infeccin.  Observar la parte interna de los senos paranasales con un dispositivo que tiene una luz (endoscopio). Si el mdico sospecha que usted padece sinusitis crnica, podra indicarle lo siguiente:  Pruebas de alergias.  Una muestra de mucosidad de la nariz (cultivo nasal) para buscar bacterias.  Examen de una muestra de mucosidad, para ver si la sinusitis se relaciona con alguna alergia. Si la sinusitis no responde al tratamiento y dura ms de 8semanas, se le podra pedir una resonancia magntica o una tomografa computarizada para examinar los senos paranasales. Estos estudios tambin ayudan a determinar la gravedad de la infeccin. En contadas ocasiones, se puede ordenar una biopsia de hueso para descartar tipos ms graves de infecciones por hongos en los senos paranasales. TRATAMIENTO El tratamiento para la sinusitis depende de la causa y de   si la afeccin es crnica o aguda. Si lo que causa la sinusitis es un virus, los sntomas desparecern por s solos en el trmino de 10das. Podran recetarle medicamentos para  aliviar los sntomas, entre los que se incluyen los siguientes:  Descongestivos nasales tpicos. Desinflaman las fosas nasales y permiten que la mucosidad drene por los senos paranasales.  Antihistamnicos. Este tipo de medicamento bloquea la inflamacin que ocasionan las alergias. Pueden ayudar a reducir la inflamacin en la nariz y los senos.  Corticoides nasales tpicos. Son aerosoles nasales que reducen la inflamacin e hinchazn en la nariz y los senos.  Lavados nasales con solucin salina. Estos enjuagues pueden ayudar a eliminar la mucosidad espesa en la nariz. Si la afeccin es causada por una bacteria, se le recetarn antibiticos. Si es causada por un hongo, se le recetarn antimicticos. Se podra necesitar ciruga para tratar enfermedades preexistentes, como las fosas nasales estrechas. Tambin podra ser necesaria para eliminar plipos. INSTRUCCIONES PARA EL CUIDADO EN EL HOGAR Medicamentos  Tome, use o aplquese los medicamentos de venta libre y recetados solamente como se lo haya indicado el mdico. Estos pueden incluir aerosoles nasales.  Si le recetaron un antibitico, tmelo como se lo haya indicado el mdico. No deje de tomar los antibiticos aunque comience a sentirse mejor. Hidrtese y humidifique los ambientes.  Beba suficiente agua para mantener la orina clara o de color amarillo plido. Mantenerse hidratado lo ayudar a diluir la mucosidad.  Use un humidificador de vapor fro para mantener la humedad de su hogar por encima del 50%.  Realice inhalaciones de vapor por 10 a 15minutos, de 3 a 4veces al da o tal como se lo haya indicado el mdico. Puede hacer esto en el bao con el vapor del agua caliente de la ducha.  Limite la exposicin al aire fro o seco. Reposo  Descanse todo lo que pueda.  Duerma con la cabeza elevada.  Asegrese de dormir lo suficiente cada noche. Instrucciones generales  Aplquese un pao tibio y hmedo en la cara 3 o 4veces al da  o como se lo haya indicado el mdico. Esto ayuda a calmar las molestias.  Lvese las manos frecuentemente con agua y jabn para reducir la exposicin a virus y otras bacterias. Use desinfectante para manos si no dispone de agua y jabn.  No fume. Evite estar cerca de personas que fuman (fumador pasivo).  Concurra a todas las visitas de control como se lo haya indicado el mdico. Esto es importante. SOLICITE ATENCIN MDICA SI:  Tiene fiebre.  Los sntomas empeoran.  Los sntomas no mejoran en el trmino de 10das. SOLICITE ATENCIN MDICA DE INMEDIATO SI:  Tiene un dolor de cabeza intenso.  Tiene vmitos persistentes.  Tiene dolor o hinchazn en la zona del rostro o los ojos.  Tiene problemas de visin.  Se siente confundido.  Tiene el cuello rgido.  Tiene dificultad para respirar. Esta informacin no tiene como fin reemplazar el consejo del mdico. Asegrese de hacerle al mdico cualquier pregunta que tenga. Document Released: 03/19/2005 Document Revised: 10/01/2015 Document Reviewed: 04/04/2015 Elsevier Interactive Patient Education  2017 Elsevier Inc.    Alergias en los adultos (Allergies, Adult) Una alergia se produce cuando el sistema de defensa del cuerpo (sistema inmunitario) reacciona en exceso a una sustancia normalmente inofensiva (alrgeno), que se respira o se come, o entra en contacto con la piel. Al estar en contacto con algo a lo que se es alrgico, el sistema inmunitario produce ciertas protenas (anticuerpos). Estas protenas hacen   que las clulas liberen sustancias qumicas (histaminas) que desencadenan los sntomas de una reaccin alrgica. Las alergias suelen afectar las fosas nasales (rinitis alrgica), los ojos (conjuntivitis alrgica), la piel (dermatitis atpica) y el estmago. Las alergias pueden ser leves o graves. No se pueden diseminar de una persona a otra (no es contagiosa). Pueden aparecer a cualquier edad y se pueden superar con los  aos. CAUSAS La causa de las alergias puede ser cualquier sustancia que el sistema inmunitario identifica errneamente como daina. Estas pueden incluir lo siguiente:  Alrgenos externos, como el polen, el pasto, las malezas, el escape de gases de automviles y esporas del moho.  Alrgenos internos, como el polvo, el humo, el moho y la caspa de las mascotas.  Alimentos, especficamente el man, la leche, los huevos, el pescado, los mariscos, la soja, las nueces y el trigo.  Medicamentos, como la penicilina.  Irritantes de la piel, como los detergentes, las sustancias qumicas y el ltex.  Perfume.  Las picaduras o mordeduras de insectos. FACTORES DE RIESGO Puede tener un riesgo mayor de sufrir alergias si otras personas de su familia tienen alergias. SNTOMAS Los sntomas dependen del tipo de alergia que tenga. Estos pueden incluir los siguientes:  Secrecin o congestin nasal.  Estornudos.  Picazn en la boca, los odos o la garganta.  Goteo posnasal.  Dolor de garganta.  Ojos rojos, lagrimosos, hinchados o con picazn.  Erupcin o ronchas en la piel.  Dolor de estmago.  Vmitos.  Diarrea.  Meteorismo.  Tos o sibilancias. Las personas con una alergia grave a los alimentos, los medicamentos o la picadura de insectos pueden tener una reaccin alrgica potencialmente mortal (anafilaxia). Los sntomas de la anafilaxia incluyen:  Ronchas.  Picazn.  Cara enrojecida.  Labios, lengua o boca hinchados.  Hinchazn u opresin en la garganta.  Dolor u opresin en el pecho.  Dificultad para respirar o falta de aire.  Latidos cardacos rpidos.  Mareos o desmayos.  Vmitos.  Diarrea.  Dolor en el abdomen. DIAGNSTICO Esta afeccin se diagnostica en funcin de lo siguiente:  Sus sntomas.  Sus antecedentes mdicos y familiares.  Un examen fsico. Es posible que necesite ver a un mdico especialista en el tratamiento de alergias (alergista). Tambin  pueden hacerle exmenes que incluyen:  Pruebas de piel para detectar qu alrgenos causan los sntomas. por ejemplo:  Prueba de puncin. En esta prueba, se pincha la piel con una aguja diminuta para exponerla a pequeas cantidades de posibles alrgenos y ver si la piel reacciona.  Prueba cutnea intradrmica. En esta prueba, se inyecta una pequea cantidad de alrgeno debajo de la piel para ver si esta reacciona.  Prueba de parche. En esta prueba, se coloca una pequea cantidad de alrgeno en la piel y luego se cubre con una venda. El mdico examinar la piel despus de un par de das para ver si hay una erupcin cutnea.  Anlisis de sangre.  Pruebas de provocacin. En esta prueba, debe inhalar una pequea cantidad de alrgeno por boca para ver si produce una reaccin alrgica. Tambin pueden pedirle que:  Lleve un registro de alimentos. Un registro de alimentos incluye todos los alimentos y las bebidas que usted consume en un da, y cualquier sntoma que experimenta.  Practique una dieta de eliminacin. Una dieta de eliminacin implica eliminar alimentos especficos de la dieta y luego incorporarlos nuevamente, uno a la vez, para averiguar si hay uno en particular que le cause una reaccin alrgica. TRATAMIENTO El tratamiento para las alergias depende de   los sntomas. El tratamiento puede incluir lo siguiente:  Compresas fras para aliviar la picazn y la hinchazn.  Gotas oftlmicas.  Aerosoles nasales.  Usar un aerosol salino o lota nasal (neti pot) para lavar la nariz (irrigacin nasal). Estos mtodos pueden ayudar a eliminar mucosidad y mantener las fosas nasales hmedas.  El uso de un humidificador.  Antihistamnicos orales u otros medicamentos para detener la reaccin alrgica y la inflamacin.  Cremas para la piel para tratar las erupciones o la picazn.  Cambios en la dieta para eliminar los desencadenantes de la alergia a los alimentos.  Exposicin repetida a  cantidades diminutas de alrgenos para generar tolerancia y prevenir reacciones alrgicas futuras (inmunoterapia). Estos incluyen los siguientes:  Vacunas contra la alergia.  Tratamiento por va oral. Esto incluye pequeas dosis de un alrgeno debajo de la lengua (inmunoterapia sublingual).  Inyeccin de epinefrina de emergencia (autoinyector) en caso de una emergencia alrgica. Se trata de un medicamento autoinyectable y medido previamente que se debe administrar en los primeros minutos de una reaccin alrgica grave. INSTRUCCIONES PARA EL CUIDADO EN EL HOGAR  Evite los alrgenos conocidos, siempre que sea posible.  Si sufre de alergia por alrgenos que estn en el aire, lvese la nariz a diario. Puede usar un aerosol o una lota nasal (neti pot) para lavar la nariz (irrigacin nasal).  Tome los medicamentos de venta libre y los recetados solamente como se lo haya indicado el mdico.  Concurra a todas las visitas de control como se lo haya indicado el mdico. Esto es importante.  Si tiene riesgo de sufrir una reaccin alrgica grave (anafilaxia), lleve su autoinyector con usted en todo momento.  Si alguna vez ha tenido anafilaxia, use una pulsera o collar de alerta mdica que diga que usted tiene una alergia grave. SOLICITE ATENCIN MDICA SI:  Los sntomas no mejoran con el tratamiento. SOLICITE ATENCIN MDICA DE INMEDIATO SI:  Tiene sntomas de anafilaxia, como los siguientes:  Boca, lengua o garganta hinchadas.  Dolor u opresin en el pecho.  Dificultad para respirar o falta de aire.  Mareos o desmayos.  Dolor abdominal intenso, vmitos o diarrea. Esta informacin no tiene como fin reemplazar el consejo del mdico. Asegrese de hacerle al mdico cualquier pregunta que tenga. Document Released: 06/09/2005 Document Revised: 06/30/2014 Document Reviewed: 12/26/2015 Elsevier Interactive Patient Education  2017 Elsevier Inc.   IF you received an x-ray today, you will receive  an invoice from [facility]. Please contact [facility] at [phone] with questions or concerns regarding your invoice.   IF you received labwork today, you will receive an invoice from [facility]. Please contact [facility] at [phone] with questions or concerns regarding your invoice.   Our billing staff will not be able to assist you with questions regarding bills from these companies.  You will be contacted with the lab results as soon as they are available. The fastest way to get your results is to activate your My Chart account. Instructions are located on the last page of this paperwork. If you have not heard from us regarding the results in 2 weeks, please contact this office.      

## 2017-07-12 ENCOUNTER — Ambulatory Visit (HOSPITAL_COMMUNITY)
Admission: EM | Admit: 2017-07-12 | Discharge: 2017-07-12 | Disposition: A | Discharge status: Home / Self Care | Payer: Self-pay | Attending: Urgent Care | Admitting: Urgent Care

## 2017-07-12 ENCOUNTER — Ambulatory Visit (INDEPENDENT_AMBULATORY_CARE_PROVIDER_SITE_OTHER): Payer: Self-pay

## 2017-07-12 ENCOUNTER — Encounter (HOSPITAL_COMMUNITY): Payer: Self-pay | Admitting: *Deleted

## 2017-07-12 ENCOUNTER — Other Ambulatory Visit: Payer: Self-pay

## 2017-07-12 DIAGNOSIS — R519 Headache, unspecified: Secondary | ICD-10-CM

## 2017-07-12 DIAGNOSIS — J029 Acute pharyngitis, unspecified: Secondary | ICD-10-CM | POA: Insufficient documentation

## 2017-07-12 DIAGNOSIS — R059 Cough, unspecified: Secondary | ICD-10-CM

## 2017-07-12 DIAGNOSIS — R0789 Other chest pain: Secondary | ICD-10-CM

## 2017-07-12 DIAGNOSIS — R05 Cough: Secondary | ICD-10-CM | POA: Insufficient documentation

## 2017-07-12 DIAGNOSIS — R51 Headache: Secondary | ICD-10-CM

## 2017-07-12 LAB — POCT RAPID STREP A: STREPTOCOCCUS, GROUP A SCREEN (DIRECT): NEGATIVE

## 2017-07-12 MED ORDER — HYDROCOD POLST-CPM POLST ER 10-8 MG/5ML PO SUER: 5.0000 mL | Freq: Every evening | ORAL | 0 refills | Status: AC | PRN

## 2017-07-12 MED ORDER — DOXYCYCLINE HYCLATE 100 MG PO CAPS: 100.0000 mg | ORAL_CAPSULE | Freq: Two times a day (BID) | ORAL | 0 refills | Status: AC

## 2017-07-12 MED ORDER — PREDNISONE 20 MG PO TABS: ORAL_TABLET | ORAL | 0 refills | Status: AC

## 2017-07-12 NOTE — ED Provider Notes (Signed)
  MRN: [id number] DOB: 03-25-1981  Subjective:   Diane Mills is a 37 y.o. female presenting for 3 week history of intermittently productive cough that elicits chest pain, shob. Has also had sinus headaches, sore throat. [place] antibiotic course, short steroid taper and cough syrup. She had some improvement but only for a few days. Denies n/v, abdominal pain. Denies smoking cigarettes.   Helmut Musterlicia is allergic to penicillins.  Helmut Musterlicia  has a past medical history of Depression. Denies past surgical history. Denies family history of cancer, diabetes, HTN, HL, heart disease, stroke, mental illness.   Objective:   Vitals: BP (!) 104/55 (BP Location: Left Arm)   Pulse 83   Temp 98.9 F (37.2 C) (Oral)   LMP 07/04/2017 (Approximate)   SpO2 99%   Physical Exam  Constitutional: She is oriented to person, place, and time. She appears well-developed and well-nourished.  HENT:  TM's intact bilaterally, no effusions or erythema. Nasal turbinates dry, nasal passages patent. Mild bilateral maxillary sinus tenderness. Oropharynx with slight post-nasal drainage, mucous membranes moist.  Eyes: Right eye exhibits no discharge. Left eye exhibits no discharge.  Neck: Normal range of motion. Neck supple.  Cardiovascular: Normal rate, regular rhythm and intact distal pulses. Exam reveals no gallop and no friction rub.  No murmur heard. Pulmonary/Chest: No respiratory distress. She has no wheezes. She has no rales.  Diminished lung sound in bibasilar fields.  Lymphadenopathy:    She has cervical adenopathy.  Neurological: She is alert and oriented to person, place, and time.  Skin: Skin is warm and dry.  Psychiatric: She has a normal mood and affect.   Results for orders placed or performed during the hospital encounter of 07/12/17 (from the past 24 hour(s))  POCT rapid strep A [facility])     Status: None   Collection Time: 07/12/17  5:58 PM  Result Value Ref Range    Streptococcus, Group A Screen (Direct) NEGATIVE NEGATIVE   Dg Chest 2 View  Result Date: 07/12/2017 CLINICAL DATA:  Cough and sore throat. EXAM: CHEST  2 VIEW COMPARISON:  None. FINDINGS: Cardiomediastinal silhouette is normal. Mediastinal contours appear intact. There is no evidence of focal airspace consolidation, pleural effusion or pneumothorax. Osseous structures are without acute abnormality. Soft tissues are grossly normal. IMPRESSION: No active cardiopulmonary disease. Electronically Signed   By: Ted Mcalpineobrinka  Dimitrova M.D.   On: 07/12/2017 18:32    Assessment and Plan :   Cough  Sinus headache  Atypical chest pain  Will cover for possible sinusitis given sinus headaches, drainage that I suspect is perpetuating cough, sore throat. Use doxycycline given penicillin allergy. Offered another steroid course for cough given that it's causing her significant chest pain and shob. Return-to-clinic precautions discussed, patient verbalized understanding.    Wallis BambergMani, Mars Scheaffer, [place] JerseyPA-C 07/12/17 [id number]

## 2017-07-12 NOTE — ED Triage Notes (Signed)
Cough, sore throat, headache, per pt started 3 wks ago,

## 2017-07-15 LAB — CULTURE, GROUP A STREP (THRC)

## 2020-07-22 ENCOUNTER — Encounter (HOSPITAL_COMMUNITY): Payer: Self-pay | Admitting: *Deleted

## 2020-07-22 ENCOUNTER — Other Ambulatory Visit: Payer: Self-pay

## 2020-07-22 ENCOUNTER — Ambulatory Visit (HOSPITAL_COMMUNITY)
Admission: EM | Admit: 2020-07-22 | Discharge: 2020-07-22 | Disposition: A | Discharge status: Home / Self Care | Payer: Self-pay | Attending: Emergency Medicine | Admitting: Emergency Medicine

## 2020-07-22 DIAGNOSIS — J069 Acute upper respiratory infection, unspecified: Secondary | ICD-10-CM | POA: Insufficient documentation

## 2020-07-22 DIAGNOSIS — J029 Acute pharyngitis, unspecified: Secondary | ICD-10-CM | POA: Insufficient documentation

## 2020-07-22 HISTORY — DX: Anxiety disorder, unspecified: F41.9

## 2020-07-22 HISTORY — DX: Type 2 diabetes mellitus without complications: E11.9

## 2020-07-22 LAB — POCT RAPID STREP A, ED / UC: Streptococcus, Group A Screen (Direct): NEGATIVE

## 2020-07-22 MED ORDER — IBUPROFEN 600 MG PO TABS: 600.0000 mg | ORAL_TABLET | Freq: Four times a day (QID) | ORAL | 0 refills | Status: AC | PRN

## 2020-07-22 MED ORDER — BENZONATATE 100 MG PO CAPS: 100.0000 mg | ORAL_CAPSULE | Freq: Three times a day (TID) | ORAL | 0 refills | Status: AC

## 2020-07-22 NOTE — ED Provider Notes (Signed)
[facility]    CSN: [id number] Arrival date & time: 07/22/20  1539      History   Chief Complaint Chief Complaint  Patient presents with  . Sore Throat    HPI Diane Mills is a 40 y.o. female.   Patient presents with sore throat x1 week.  She also reports nonproductive cough and postnasal drip.  She states her entire family has had similar symptoms.  She denies fever, rash, shortness of breath, vomiting, diarrhea, or other symptoms.  Treatment attempted at home with OTC cold medication.  Medical history includes diabetes, depression, anxiety.  The history is provided by the patient. A language interpreter was used.    Past Medical History:  Diagnosis Date  . Anxiety   . Depression   . Diabetes mellitus without complication (HCC)     There are no problems to display for this patient.   No past surgical history on file.  OB History   No obstetric history on file.      Home Medications    Prior to Admission medications   Medication Sig Start Date End Date Taking? Authorizing Provider  benzonatate (TESSALON) 100 MG capsule Take 1 capsule (100 mg total) by mouth every 8 (eight) hours. 07/22/20  Yes Mickie Bail, NP  ibuprofen (ADVIL) 600 MG tablet Take 1 tablet (600 mg total) by mouth every 6 (six) hours as needed. 07/22/20  Yes Mickie Bail, NP  cetirizine (ZYRTEC) 10 MG tablet Take 10 mg by mouth daily.    [provider]  chlorpheniramine-HYDROcodone (TUSSIONEX PENNKINETIC ER) 10-8 MG/5ML SUER Take 5 mLs by mouth at bedtime as needed for cough. 07/12/17   Wallis Bamberg, PA-C  citalopram (CELEXA) 40 MG tablet Take 40 mg by mouth daily.    [provider]  doxycycline (VIBRAMYCIN) 100 MG capsule Take 1 capsule (100 mg total) by mouth 2 (two) times daily. 07/12/17   Wallis Bamberg, PA-C  Doxylamine-DM (NIGHTTIME COUGH PO) Take by mouth.    [provider]  predniSONE (DELTASONE) 20 MG tablet Take 2 tablets daily with breakfast.  07/12/17   Wallis Bamberg, PA-C  pseudoephedrine (SUDAFED 12 HOUR) 120 MG 12 hr tablet Take 1 tablet (120 mg total) by mouth 2 (two) times daily. 10/22/16   Wallis Bamberg, PA-C    Family History No family history on file.  Social History Social History   Tobacco Use  . Smoking status: Never Smoker  . Smokeless tobacco: Never Used  Vaping Use  . Vaping Use: Never used  Substance Use Topics  . Alcohol use: No    Alcohol/week: 0.0 standard drinks  . Drug use: No     Allergies   Penicillins   Review of Systems Review of Systems  Constitutional: Negative for chills and fever.  HENT: Positive for postnasal drip and sore throat. Negative for ear pain.   Eyes: Negative for pain and visual disturbance.  Respiratory: Positive for cough. Negative for shortness of breath.   Cardiovascular: Negative for chest pain and palpitations.  Gastrointestinal: Negative for abdominal pain, diarrhea and vomiting.  Genitourinary: Negative for dysuria and hematuria.  Musculoskeletal: Negative for arthralgias and back pain.  Skin: Negative for color change and rash.  Neurological: Negative for seizures and syncope.  All other systems reviewed and are negative.    Physical Exam Triage Vital Signs ED Triage Vitals  Enc Vitals Group     BP      Pulse      Resp  Temp      Temp src      SpO2      Weight      Height      Head Circumference      Peak Flow      Pain Score      Pain Loc      Pain Edu?      Excl. in GC?    No data found.  Updated Vital Signs BP 114/67 (BP Location: Left Arm)   Pulse 77   Temp 98.8 F (37.1 C) (Oral)   Resp 16   LMP 07/22/2020   SpO2 99%   Visual Acuity Right Eye Distance:   Left Eye Distance:   Bilateral Distance:    Right Eye Near:   Left Eye Near:    Bilateral Near:     Physical Exam Vitals and nursing note reviewed.  Constitutional:      General: She is not in acute distress.    Appearance: She is well-developed and well-nourished. She  is not ill-appearing.  HENT:     Head: Normocephalic and atraumatic.     Right Ear: Tympanic membrane normal.     Left Ear: Tympanic membrane normal.     Nose: Nose normal.     Mouth/Throat:     Mouth: Mucous membranes are moist.     Pharynx: Posterior oropharyngeal erythema present.     Tonsils: 0 on the right. 0 on the left.  Eyes:     Conjunctiva/sclera: Conjunctivae normal.  Cardiovascular:     Rate and Rhythm: Normal rate and regular rhythm.     Heart sounds: Normal heart sounds.  Pulmonary:     Effort: Pulmonary effort is normal. No respiratory distress.     Breath sounds: Normal breath sounds.  Abdominal:     Palpations: Abdomen is soft.     Tenderness: There is no abdominal tenderness. There is no guarding or rebound.  Musculoskeletal:        General: No edema.     Cervical back: Neck supple.  Skin:    General: Skin is warm and dry.     Findings: No rash.  Neurological:     General: No focal deficit present.     Mental Status: She is alert and oriented to person, place, and time.     Gait: Gait normal.  Psychiatric:        Mood and Affect: Mood and affect and mood normal.        Behavior: Behavior normal.      UC Treatments / Results  Labs (all labs ordered are listed, but only abnormal results are displayed) Labs Reviewed  CULTURE, GROUP A STREP [facility])  POCT RAPID STREP A, ED / UC    EKG   Radiology No results found.  Procedures Procedures (including critical care time)  Medications Ordered in UC Medications - No data to display  Initial Impression / Assessment and Plan / UC Course  I have reviewed the triage vital signs and the nursing notes.  Pertinent labs & imaging results that were available during my care of the patient were reviewed by me and considered in my medical decision making (see chart for details).   Sore throat, Viral URI with cough.  Rapid strep negative; culture pending.  Patient refuses Covid test today.  Treating with  ibuprofen and Tessalon Perles.  Instructed her to follow-up with her PCP if her symptoms are not improving.  She agrees to plan of care.  Final Clinical Impressions(s) / UC Diagnoses   Final diagnoses:  Sore throat  Viral URI with cough     Discharge Instructions     Your rapid strep test is negative.  A throat culture is pending; we will call you if it is positive requiring treatment.    Take the ibuprofen as needed for fever or discomfort.  Take the [facility] as needed for cough.    Follow up with your primary care provider if your symptoms are not improving.         ED Prescriptions    Medication Sig Dispense Auth. Provider   ibuprofen (ADVIL) 600 MG tablet Take 1 tablet (600 mg total) by mouth every 6 (six) hours as needed. 30 tablet Mickie Bail, NP   benzonatate (TESSALON) 100 MG capsule Take 1 capsule (100 mg total) by mouth every 8 (eight) hours. 21 capsule Mickie Bail, NP     PDMP not reviewed this encounter.   Mickie Bail, NP 07/22/20 [phone]

## 2020-07-22 NOTE — ED Triage Notes (Signed)
Pt reports sore throat started one week ago. Pt reports the whole family has been sick but noone has been tested for COVID. Pt wants to be checked out for ear infection ,throat infection . Pt wants meds for sore throat. Pt does not want a COVID test

## 2020-07-22 NOTE — Discharge Instructions (Signed)
Your rapid strep test is negative.  A throat culture is pending; we will call you if it is positive requiring treatment.    Take the ibuprofen as needed for fever or discomfort.  Take the [facility] as needed for cough.    Follow up with your primary care provider if your symptoms are not improving.

## 2020-07-25 LAB — CULTURE, GROUP A STREP (THRC)

## 2024-01-03 ENCOUNTER — Encounter (HOSPITAL_COMMUNITY): Payer: Self-pay

## 2024-01-03 ENCOUNTER — Ambulatory Visit (HOSPITAL_COMMUNITY)
Admission: EM | Admit: 2024-01-03 | Discharge: 2024-01-03 | Disposition: A | Discharge status: Home / Self Care | Payer: Self-pay | Attending: Family Medicine | Admitting: Family Medicine

## 2024-01-03 DIAGNOSIS — H60391 Other infective otitis externa, right ear: Secondary | ICD-10-CM

## 2024-01-03 DIAGNOSIS — H9201 Otalgia, right ear: Secondary | ICD-10-CM

## 2024-01-03 MED ORDER — HYDROCORTISONE-ACETIC ACID 1-2 % OT SOLN: 3.0000 [drp] | Freq: Three times a day (TID) | OTIC | 0 refills | Status: DC

## 2024-01-03 MED ORDER — CIPROFLOXACIN HCL 500 MG PO TABS: 500.0000 mg | ORAL_TABLET | Freq: Two times a day (BID) | ORAL | 0 refills | Status: DC

## 2024-01-03 NOTE — Discharge Instructions (Addendum)
 It was nice seeing you. It seems you have an infection of the external surface of your ears. We will treat you for both fungal and bacterial ear infections, given your clinical presentation. I sent an oral antibiotic and antifungal, plus anti-inflammatory ear drops, to your pharmacy. See PCP soon to refer you to an ENT if symptoms fail to improve.   Fue agradable verte. Parece que tienes una infeccin en la superficie externa de tus odos. Lo trataremos para infecciones de odo tanto fngicas como bacterianas, dada su presentacin clnica. Envi a su farmacia un antibitico oral y un antifngico, adems de gotas antiinflamatorias para los odos. Consulte pronto a su PCP para derivarlo a un otorrinolaringlogo si los sntomas no mejoran.

## 2024-01-03 NOTE — ED Provider Notes (Addendum)
 [facility]    CSN: [id number] Arrival date & time: 01/03/24  1545      History   Chief Complaint No chief complaint on file.   HPI Diane Mills is a 43 y.o. female.   The history is provided by the patient. 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  Otalgia Location:  Right Quality:  Aching Severity:  Moderate Onset quality:  Gradual Duration:  1 week Progression:  Worsening (Right ear pain x 1 week. Improved for a few day and now worsening) Chronicity:  New Relieved by:  Nothing Worsened by:  Nothing Ineffective treatments: She used Tylenol and Advil  with mild improvement. She also tried OTC meds - LipoFlavonoid drop - lidocaine ear drop. Associated symptoms: no congestion, no cough, no ear discharge, no fever, no headaches, no hearing loss and no sore throat   Associated symptoms comment:  Her right ear is painful with intermittent itching Risk factors: no chronic ear infection   Denies swimming.  She has diabetes and is compliant with her Metformin.  Past Medical History:  Diagnosis Date   Anxiety    Depression    Diabetes mellitus without complication (HCC)     There are no active problems to display for this patient.   History reviewed. No pertinent surgical history.  OB History   No obstetric history on file.      Home Medications    Prior to Admission medications   Medication Sig Start Date End Date Taking? Authorizing Provider  acetic acid -hydrocortisone  (VOSOL -HC) OTIC solution Place 3 drops into the right ear 3 (three) times daily for 7 days. 01/03/24 01/10/24 Yes Anders Otto DASEN, MD  cetirizine (ZYRTEC) 10 MG tablet Take 10 mg by mouth daily.   Yes [provider]  ciprofloxacin  (CIPRO ) 500 MG tablet Take 1 tablet (500 mg total) by mouth every 12 (twelve) hours. 01/03/24  Yes Anders Otto DASEN, MD  citalopram  (CELEXA ) 40 MG tablet Take 40 mg by mouth daily.   Yes [provider]  benzonatate   (TESSALON ) 100 MG capsule Take 1 capsule (100 mg total) by mouth every 8 (eight) hours. 07/22/20   Corlis Burnard DEL, NP  chlorpheniramine-HYDROcodone [facility] PENNKINETIC ER) 10-8 MG/5ML SUER Take 5 mLs by mouth at bedtime as needed for cough. 07/12/17   Christopher Savannah, PA-C  citalopram  (CELEXA ) 40 MG tablet Take 40 mg by mouth daily.    [provider]  doxycycline  (VIBRAMYCIN ) 100 MG capsule Take 1 capsule (100 mg total) by mouth 2 (two) times daily. 07/12/17   Christopher Savannah, PA-C  Doxylamine-DM (NIGHTTIME COUGH PO) Take by mouth.    [provider]  ibuprofen  (ADVIL ) 600 MG tablet Take 1 tablet (600 mg total) by mouth every 6 (six) hours as needed. 07/22/20   Corlis Burnard DEL, NP  predniSONE  (DELTASONE ) 20 MG tablet Take 2 tablets daily with breakfast. 07/12/17   Christopher Savannah, PA-C  pseudoephedrine  (SUDAFED 12 HOUR) 120 MG 12 hr tablet Take 1 tablet (120 mg total) by mouth 2 (two) times daily. 10/22/16   Christopher Savannah, PA-C    Family History History reviewed. No pertinent family history.  Social History Social History   Tobacco Use   Smoking status: Never   Smokeless tobacco: Never  Vaping Use   Vaping status: Never Used  Substance Use Topics   Alcohol use: No    Alcohol/week: 0.0 standard drinks of alcohol   Drug use: No     Allergies   Penicillins   Review  of Systems Review of Systems  Constitutional:  Negative for fever.  HENT:  Positive for ear pain. Negative for congestion, ear discharge, hearing loss and sore throat.   Respiratory:  Negative for cough.   Neurological:  Negative for headaches.  All other systems reviewed and are negative.    Physical Exam Triage Vital Signs ED Triage Vitals  Encounter Vitals Group     BP 01/03/24 1609 96/67     Girls Systolic BP Percentile --      Girls Diastolic BP Percentile --      Boys Systolic BP Percentile --      Boys Diastolic BP Percentile --      Pulse Rate 01/03/24 1609 73     Resp 01/03/24 1609 18     Temp  01/03/24 1609 98.4 F (36.9 C)     Temp Source 01/03/24 1609 Oral     SpO2 01/03/24 1609 95 %     Weight --      Height --      Head Circumference --      Peak Flow --      Pain Score 01/03/24 1613 6     Pain Loc --      Pain Education --      Exclude from Growth Chart --    No data found.  Updated Vital Signs BP 96/67 (BP Location: Left Arm)   Pulse 73   Temp 98.4 F (36.9 C) (Oral)   Resp 18   LMP 12/06/2023   SpO2 95%   Visual Acuity Right Eye Distance:   Left Eye Distance:   Bilateral Distance:    Right Eye Near:   Left Eye Near:    Bilateral Near:     Physical Exam Vitals and nursing note reviewed.  Constitutional:      General: She is not in acute distress.    Appearance: She is not toxic-appearing.  HENT:     Right Ear: There is no impacted cerumen.     Left Ear: Tympanic membrane, ear canal and external ear normal. There is no impacted cerumen.  Cardiovascular:     Rate and Rhythm: Normal rate and regular rhythm.     Pulses: Normal pulses.     Heart sounds: Normal heart sounds. No murmur heard. Pulmonary:     Effort: Pulmonary effort is normal. No respiratory distress.     Breath sounds: Normal breath sounds. No wheezing.  Musculoskeletal:     Cervical back: Normal range of motion and neck supple.  Lymphadenopathy:     Cervical: No cervical adenopathy.      UC Treatments / Results  Labs (all labs ordered are listed, but only abnormal results are displayed) Labs Reviewed - No data to display  EKG   Radiology No results found.  Procedures Procedures (including critical care time)  Medications Ordered in UC Medications - No data to display  Initial Impression / Assessment and Plan / UC Course  I have reviewed the triage vital signs and the nursing notes.  Pertinent labs & imaging results that were available during my care of the patient were reviewed by me and considered in my medical decision making (see chart for details).  Clinical  Course as of 01/03/24 1708  Sun Jan 03, 2024  1656 Otalgia No signs of otitis media Differential diagnosis includes otitis externa due to bacterial vs fungal Will treat for both Oral ciprofloxacin  prescribed for bacterial infection given hx of DM Acetic acid /Hydrocortisone  otic e-scribed for  fungal infection Tylenol prn pain ENT evaluation suggested, given her hx of hearing loss Unfortunately, we lack resources for a hearing exam at the UC Return precautions discussed [KE]    Clinical Course User Index [KE] Anders Otto DASEN, MD     Final Clinical Impressions(s) / UC Diagnoses   Final diagnoses:  Right ear pain  Infective otitis externa of right ear     Discharge Instructions      It was nice seeing you. It seems you have an infection of the external surface of your ears. We will treat you for both fungal and bacterial ear infections, given your clinical presentation. I sent an oral antibiotic and antifungal, plus anti-inflammatory ear drops, to your pharmacy. See PCP soon to refer you to an ENT if symptoms fail to improve.   Fue agradable verte. Parece que tienes una infeccin en la superficie externa de tus odos. Lo trataremos para infecciones de odo tanto fngicas como bacterianas, dada su presentacin clnica. Envi a su farmacia un antibitico oral y un antifngico, adems de gotas antiinflamatorias para los odos. Consulte pronto a su PCP para derivarlo a un otorrinolaringlogo si los sntomas no mejoran.     ED Prescriptions     Medication Sig Dispense Auth. Provider   ciprofloxacin  (CIPRO ) 500 MG tablet Take 1 tablet (500 mg total) by mouth every 12 (twelve) hours. 10 tablet Anders Otto DASEN, MD   acetic acid -hydrocortisone  (VOSOL -HC) OTIC solution Place 3 drops into the right ear 3 (three) times daily for 7 days. 10 mL Anders Otto DASEN, MD      PDMP not reviewed this encounter.   Anders Otto DASEN, MD 01/03/24 1656    Anders Otto DASEN, MD 01/03/24  1706    Anders Otto DASEN, MD 01/03/24 [phone]

## 2024-01-03 NOTE — ED Triage Notes (Signed)
 Patient presents to the office for R-ear pain and pressure.. Patient has been using OTC eye drops with no relief.

## 2024-01-05 ENCOUNTER — Telehealth (HOSPITAL_COMMUNITY): Payer: Self-pay

## 2024-01-05 MED ORDER — HYDROCORTISONE-ACETIC ACID 1-2 % OT SOLN: 3.0000 [drp] | Freq: Three times a day (TID) | OTIC | 0 refills | Status: AC

## 2024-01-05 MED ORDER — CIPROFLOXACIN HCL 500 MG PO TABS: 500.0000 mg | ORAL_TABLET | Freq: Two times a day (BID) | ORAL | 0 refills | Status: AC

## 2024-01-05 NOTE — Telephone Encounter (Signed)
Pt called requesting pharmacy change.
